# Patient Record
Sex: Female | Born: 1990 | Race: Black or African American | Hispanic: No | Marital: Single | Smoking: Never smoker
Health system: Southern US, Community
[De-identification: ages and names within clinical notes are randomized; demographics above are authoritative.]

## PROBLEM LIST (undated history)

## (undated) HISTORY — PX: ANKLE SURGERY: SHX546

---

## 2017-04-27 NOTE — L&D Delivery Note (Signed)
Delivery Summary for [name] BockPaula [facility]  Labor Events:   Preterm labor:   Rupture date: 10/18/2017  Rupture time: 9:31 AM  Rupture type: Artificial  Fluid Color:   Induction:   Augmentation:   Complications:   Cervical ripening:          Delivery:   Episiotomy:   Lacerations:   Repair suture:   Repair # of packets:   Blood loss (ml): 2230 ml   Information for the patient's newborn:  Chaney Bornerry, [facility] [[id number]][[id number]]    Delivery 10/18/2017 1:55 PM by  C-Section, Low Transverse Sex:  female Gestational Age: [id number] Delivery Clinician:  Hildred Laserherry, Ayva Veilleux MD Living?: No        APGARS  One minute Five minutes Ten minutes  Skin color:        Heart rate:        Grimace:        Muscle tone:        Breathing:        Totals: 0  0      Presentation/position:      Resuscitation:   Cord information:    Disposition of cord blood:     Blood gases sent?  Complications:   Placenta: Delivered:       appearance Newborn Measurements: Weight: 6 lb 6 oz (2892 g)  Height: 20.75"  Head circumference:    Chest circumference:    Other providers:    Additional  information: Forceps:   Vacuum:   Breech:   Observed anomalies       See Dr. Oretha Milchherry's operative note for details of C-section procedure.    Hildred Laserherry, Bobbyjoe Pabst, MD [facility]

## 2017-05-14 ENCOUNTER — Encounter: Payer: Self-pay | Admitting: Certified Nurse Midwife

## 2017-05-14 ENCOUNTER — Ambulatory Visit (INDEPENDENT_AMBULATORY_CARE_PROVIDER_SITE_OTHER): Payer: BLUE CROSS/BLUE SHIELD | Admitting: Certified Nurse Midwife

## 2017-05-14 VITALS — BP 129/75 | HR 103 | Ht 65.0 in | Wt 193.4 lb

## 2017-05-14 DIAGNOSIS — Z3402 Encounter for supervision of normal first pregnancy, second trimester: Secondary | ICD-10-CM

## 2017-05-14 DIAGNOSIS — N926 Irregular menstruation, unspecified: Secondary | ICD-10-CM

## 2017-05-14 LAB — POCT URINALYSIS DIPSTICK
Bilirubin, UA: NEGATIVE
GLUCOSE UA: NEGATIVE
LEUKOCYTES UA: NEGATIVE
NITRITE UA: NEGATIVE
Protein, UA: NEGATIVE
RBC UA: NEGATIVE
Urobilinogen, UA: 0.2 E.U./dL
pH, UA: 6 (ref 5.0–8.0)

## 2017-05-14 NOTE — Patient Instructions (Signed)
Back Pain in Pregnancy Back pain during pregnancy is common. Back pain may be caused by several factors that are related to changes during your pregnancy. Follow these instructions at home: Managing pain, stiffness, and swelling  If directed, apply ice for sudden (acute) back pain. ? Put ice in a plastic bag. ? Place a towel between your skin and the bag. ? Leave the ice on for 20 minutes, 2-3 times per day.  If directed, apply heat to the affected area before you exercise: ? Place a towel between your skin and the heat pack or heating pad. ? Leave the heat on for 20-30 minutes. ? Remove the heat if your skin turns bright red. This is especially important if you are unable to feel pain, heat, or cold. You may have a greater risk of getting burned. Activity  Exercise as told by your health care provider. Exercising is the best way to prevent or manage back pain.  Listen to your body when lifting. If lifting hurts, ask for help or bend your knees. This uses your leg muscles instead of your back muscles.  Squat down when picking up something from the floor. Do not bend over.  Only use bed rest as told by your health care provider. Bed rest should only be used for the most severe episodes of back pain. Standing, Sitting, and Lying Down  Do not stand in one place for long periods of time.  Use good posture when sitting. Make sure your head rests over your shoulders and is not hanging forward. Use a pillow on your lower back if necessary.  Try sleeping on your side, preferably the left side, with a pillow or two between your legs. If you are sore after a night's rest, your bed may be too soft. A firm mattress may provide more support for your back during pregnancy. General instructions  Do not wear high heels.  Eat a healthy diet. Try to gain weight within your health care provider's recommendations.  Use a maternity girdle, elastic sling, or back brace as told by your health care  provider.  Take over-the-counter and prescription medicines only as told by your health care provider.  Keep all follow-up visits as told by your health care provider. This is important. This includes any visits with any specialists, such as a physical therapist. Contact a health care provider if:  Your back pain interferes with your daily activities.  You have increasing pain in other parts of your body. Get help right away if:  You develop numbness, tingling, weakness, or problems with the use of your arms or legs.  You develop severe back pain that is not controlled with medicine.  You have a sudden change in bowel or bladder control.  You develop shortness of breath, dizziness, or you faint.  You develop nausea, vomiting, or sweating.  You have back pain that is a rhythmic, cramping pain similar to labor pains. Labor pain is usually 1-2 minutes apart, lasts for about 1 minute, and involves a bearing down feeling or pressure in your pelvis.  You have back pain and your water breaks or you have vaginal bleeding.  You have back pain or numbness that travels down your leg.  Your back pain developed after you fell.  You develop pain on one side of your back.  You see blood in your urine.  You develop skin blisters in the area of your back pain. This information is not intended to replace advice given to you   by your health care provider. Make sure you discuss any questions you have with your health care provider. Document Released: 07/22/2005 Document Revised: 09/19/2015 Document Reviewed: 12/26/2014 Elsevier Interactive Patient Education  2018 [place]. Abdominal Pain During Pregnancy Belly (abdominal) pain is common during pregnancy. Most of the time, it is not a serious problem. Other times, it can be a sign that something is wrong with the pregnancy. Always tell your doctor if you have belly pain. Follow these instructions at home: Monitor your belly pain for any  changes. The following actions may help you feel better:  Do not have sex (intercourse) or put anything in your vagina until you feel better.  Rest until your pain stops.  Drink clear fluids if you feel sick to your stomach (nauseous). Do not eat solid food until you feel better.  Only take medicine as told by your doctor.  Keep all doctor visits as told.  Get help right away if:  You are bleeding, leaking fluid, or pieces of tissue come out of your vagina.  You have more pain or cramping.  You keep throwing up (vomiting).  You have pain when you pee (urinate) or have blood in your pee.  You have a fever.  You do not feel your baby moving as much.  You feel very weak or feel like passing out.  You have trouble breathing, with or without belly pain.  You have a very bad headache and belly pain.  You have fluid leaking from your vagina and belly pain.  You keep having watery poop (diarrhea).  Your belly pain does not go away after resting, or the pain gets worse. This information is not intended to replace advice given to you by your health care provider. Make sure you discuss any questions you have with your health care provider. Document Released: 04/01/2009 Document Revised: 11/20/2015 Document Reviewed: 11/10/2012 Elsevier Interactive Patient Education  2018 [employer]. Morning Sickness Morning sickness is when you feel sick to your stomach (nauseous) during pregnancy. You may feel sick to your stomach and throw up (vomit). You may feel sick in the morning, but you can feel this way any time of day. Some women feel very sick to their stomach and cannot stop throwing up (hyperemesis gravidarum). Follow these instructions at home:  Only take medicines as told by your doctor.  Take multivitamins as told by your doctor. Taking multivitamins before getting pregnant can stop or lessen the harshness of morning sickness.  Eat dry toast or unsalted crackers before getting  out of bed.  Eat 5 to 6 small meals a day.  Eat dry and bland foods like rice and baked potatoes.  Do not drink liquids with meals. Drink between meals.  Do not eat greasy, fatty, or spicy foods.  Have someone cook for you if the smell of food causes you to feel sick or throw up.  If you feel sick to your stomach after taking prenatal vitamins, take them at night or with a snack.  Eat protein when you need a snack (nuts, yogurt, cheese).  Eat unsweetened gelatins for dessert.  Wear a bracelet used for sea sickness (acupressure wristband).  Go to a doctor that puts thin needles into certain body points (acupuncture) to improve how you feel.  Do not smoke.  Use a humidifier to keep the air in your house free of odors.  Get lots of fresh air. Contact a doctor if:  You need medicine to feel better.  You feel  dizzy or lightheaded.  You are losing weight. Get help right away if:  You feel very sick to your stomach and cannot stop throwing up.  You pass out (faint). This information is not intended to replace advice given to you by your health care provider. Make sure you discuss any questions you have with your health care provider. Document Released: 05/21/2004 Document Revised: 09/19/2015 Document Reviewed: 09/28/2012 Elsevier Interactive Patient Education  2017 [place] for Pregnant Women While you are pregnant, your body will require additional nutrition to help support your growing baby. It is recommended that you consume:  150 additional calories each day during your first trimester.  300 additional calories each day during your second trimester.  300 additional calories each day during your third trimester.  Eating a healthy, well-balanced diet is very important for your health and for your baby's health. You also have a higher need for some vitamins and minerals, such as folic acid, calcium, iron, and vitamin D. What do I need to know about  eating during pregnancy?  Do not try to lose weight or go on a diet during pregnancy.  Choose healthy, nutritious foods. Choose  of a sandwich with a glass of milk instead of a candy bar or a high-calorie sugar-sweetened beverage.  Limit your overall intake of foods that have "empty calories." These are foods that have little nutritional value, such as sweets, desserts, candies, sugar-sweetened beverages, and fried foods.  Eat a variety of foods, especially fruits and vegetables.  Take a prenatal vitamin to help meet the additional needs during pregnancy, specifically for folic acid, iron, calcium, and vitamin D.  Remember to stay active. Ask your health care provider for exercise recommendations that are specific to you.  Practice good food safety and cleanliness, such as washing your hands before you eat and after you prepare raw meat. This helps to prevent foodborne illnesses, such as listeriosis, that can be very dangerous for your baby. Ask your health care provider for more information about listeriosis. What does 150 extra calories look like? Healthy options for an additional 150 calories each day could be any of the following:  Plain low-fat yogurt (6-8 oz) with  cup of berries.  1 apple with 2 teaspoons of peanut butter.  Cut-up vegetables with  cup of hummus.  Low-fat chocolate milk (8 oz or 1 cup).  1 string cheese with 1 medium orange.   of a peanut butter and jelly sandwich on whole-wheat bread (1 tsp of peanut butter).  For 300 calories, you could eat two of those healthy options each day. What is a healthy amount of weight to gain? The recommended amount of weight for you to gain is based on your pre-pregnancy BMI. If your pre-pregnancy BMI was:  Less than 18 (underweight), you should gain 28-40 lb.  18-24.9 (normal), you should gain 25-35 lb.  25-29.9 (overweight), you should gain 15-25 lb.  Greater than 30 (obese), you should gain 11-20 lb.  What if I am  having twins or multiples? Generally, pregnant women who will be having twins or multiples may need to increase their daily calories by 300-600 calories each day. The recommended range for total weight gain is 25-54 lb, depending on your pre-pregnancy BMI. Talk with your health care provider for specific guidance about additional nutritional needs, weight gain, and exercise during your pregnancy. What foods can I eat? Grains Any grains. Try to choose whole grains, such as whole-wheat bread, oatmeal, or brown rice. Vegetables  Any vegetables. Try to eat a variety of colors and types of vegetables to get a full range of vitamins and minerals. Remember to wash your vegetables well before eating. Fruits Any fruits. Try to eat a variety of colors and types of fruit to get a full range of vitamins and minerals. Remember to wash your fruits well before eating. Meats and Other Protein Sources Lean meats, including chicken, [place], fish, and lean cuts of beef, veal, or pork. Make sure that all meats are cooked to "well done." Tofu. Tempeh. Beans. Eggs. Peanut butter and other nut butters. Seafood, such as shrimp, crab, and lobster. If you choose fish, select types that are higher in omega-3 fatty acids, including salmon, herring, mussels, trout, sardines, and pollock. Make sure that all meats are cooked to food-safe temperatures. Dairy Pasteurized milk and milk alternatives. Pasteurized yogurt and pasteurized cheese. Cottage cheese. Sour cream. Beverages Water. Juices that contain 100% fruit juice or vegetable juice. Caffeine-free teas and decaffeinated coffee. Drinks that contain caffeine are okay to drink, but it is better to avoid caffeine. Keep your total caffeine intake to less than 200 mg each day (12 oz of coffee, tea, or soda) or as directed by your health care provider. Condiments Any pasteurized condiments. Sweets and Desserts Any sweets and desserts. Fats and Oils Any fats and oils. The items  listed above may not be a complete list of recommended foods or beverages. Contact your dietitian for more options. What foods are not recommended? Vegetables Unpasteurized (raw) vegetable juices. Fruits Unpasteurized (raw) fruit juices. Meats and Other Protein Sources Cured meats that have nitrates, such as bacon, salami, and hotdogs. Luncheon meats, bologna, or other deli meats (unless they are reheated until they are steaming hot). Refrigerated pate, meat spreads from a meat counter, smoked seafood that is found in the refrigerated section of a store. Raw fish, such as sushi or sashimi. High mercury content fish, such as tilefish, shark, swordfish, and king mackerel. Raw meats, such as tuna or beef tartare. Undercooked meats and poultry. Make sure that all meats are cooked to food-safe temperatures. Dairy Unpasteurized (raw) milk and any foods that have raw milk in them. Soft cheeses, such as feta, queso blanco, queso fresco, Brie, Camembert cheeses, blue-veined cheeses, and Panela cheese (unless it is made with pasteurized milk, which must be stated on the label). Beverages Alcohol. Sugar-sweetened beverages, such as sodas, teas, or energy drinks. Condiments Homemade fermented foods and drinks, such as pickles, sauerkraut, or kombucha drinks. (Store-bought pasteurized versions of these are okay.) Other Salads that are made in the store, such as ham salad, chicken salad, egg salad, tuna salad, and seafood salad. The items listed above may not be a complete list of foods and beverages to avoid. Contact your dietitian for more information. This information is not intended to replace advice given to you by your health care provider. Make sure you discuss any questions you have with your health care provider. Document Released: 01/26/2014 Document Revised: 09/19/2015 Document Reviewed: 09/26/2013 Elsevier Interactive Patient Education  2018 [employer]. Common Medications Safe in  Pregnancy  Acne:      Constipation:  Benzoyl Peroxide     Colace  Clindamycin      Dulcolax Suppository  Topica Erythromycin     Fibercon  Salicylic Acid      Metamucil         Miralax AVOID:        Senakot   Accutane    Cough:  Retin-A  Cough Drops  Tetracycline      Phenergan w/ Codeine if Rx  Minocycline      Robitussin (Plain & DM)  Antibiotics:     Crabs/Lice:  Ceclor       RID  Cephalosporins    AVOID:  E-Mycins      Kwell  Keflex  Macrobid/Macrodantin   Diarrhea:  Penicillin      Kao-Pectate  Zithromax      Imodium AD         PUSH FLUIDS AVOID:       Cipro     Fever:  Tetracycline      Tylenol (Regular or Extra  Minocycline       Strength)  Levaquin      Extra Strength-Do not          Exceed 8 tabs/24 hrs Caffeine:        <252m/day (equiv. To 1 cup of coffee or  approx. 3 12 oz sodas)         Gas: Cold/Hayfever:       Gas-X  Benadryl      Mylicon  Claritin       Phazyme  **Claritin-D        Chlor-Trimeton    Headaches:  Dimetapp      ASA-Free Excedrin  Drixoral-Non-Drowsy     Cold Compress  Mucinex (Guaifenasin)     Tylenol (Regular or Extra  Sudafed/Sudafed-12 Hour     Strength)  **Sudafed PE Pseudoephedrine   Tylenol Cold & Sinus     Vicks Vapor Rub  Zyrtec  **AVOID if Problems With Blood Pressure         Heartburn: Avoid lying down for at least 1 hour after meals  Aciphex      Maalox     Rash:  Milk of Magnesia     Benadryl    Mylanta       1% Hydrocortisone Cream  Pepcid  Pepcid Complete   Sleep Aids:  Prevacid      Ambien   Prilosec       Benadryl  Rolaids       Chamomile Tea  Tums (Limit 4/day)     Unisom  Zantac       Tylenol PM         Warm milk-add vanilla or  Hemorrhoids:       Sugar for taste  Anusol/Anusol H.C.  (RX: Analapram 2.5%)  Sugar Substitutes:  Hydrocortisone OTC     Ok in moderation  Preparation H      Tucks        Vaseline lotion applied to tissue with  wiping    Herpes:     Throat:  Acyclovir      Oragel  Famvir  Valtrex     Vaccines:         Flu Shot Leg Cramps:       *Gardasil  Benadryl      Hepatitis A         Hepatitis B Nasal Spray:       Pneumovax  Saline Nasal Spray     Polio Booster         Tetanus Nausea:       Tuberculosis test or PPD  Vitamin B6 25 mg TID   AVOID:    Dramamine      *Gardasil  Emetrol       Live Poliovirus  Ginger Root 250 mg QID    MMR (measles, mumps &  High Complex Carbs @ Bedtime    rebella)  Sea Bands-Accupressure    Varicella (Chickenpox)  Unisom 1/2 tab TID     *No known complications           If received before Pain:         Known pregnancy;   Darvocet       Resume series after  Lortab        Delivery  Percocet    Yeast:   Tramadol      Femstat  Tylenol 3      Gyne-lotrimin  Ultram       Monistat  Vicodin           MISC:         All Sunscreens           Hair Coloring/highlights          Insect Repellant's          (Including DEET)         Mystic Tans Second Trimester of Pregnancy The second trimester is from week 13 through week 28, month 4 through 6. This is often the time in pregnancy that you feel your best. Often times, morning sickness has lessened or quit. You may have more energy, and you may get hungry more often. Your unborn baby (fetus) is growing rapidly. At the end of the sixth month, he or she is about 9 inches long and weighs about 1 pounds. You will likely feel the baby move (quickening) between 18 and 20 weeks of pregnancy. Follow these instructions at home:  Avoid all smoking, herbs, and alcohol. Avoid drugs not approved by your doctor.  Do not use any tobacco products, including cigarettes, chewing tobacco, and electronic cigarettes. If you need help quitting, ask your doctor. You may get counseling or other support to help you quit.  Only take medicine as told by your doctor. Some medicines are safe and some are not during pregnancy.  Exercise only as told by your  doctor. Stop exercising if you start having cramps.  Eat regular, healthy meals.  Wear a good support bra if your breasts are tender.  Do not use hot tubs, steam rooms, or saunas.  Wear your seat belt when driving.  Avoid raw meat, uncooked cheese, and liter boxes and soil used by cats.  Take your prenatal vitamins.  Take 1500-2000 milligrams of calcium daily starting at the 20th week of pregnancy until you deliver your baby.  Try taking medicine that helps you poop (stool softener) as needed, and if your doctor approves. Eat more fiber by eating fresh fruit, vegetables, and whole grains. Drink enough fluids to keep your pee (urine) clear or pale yellow.  Take warm water baths (sitz baths) to soothe pain or discomfort caused by hemorrhoids. Use hemorrhoid cream if your doctor approves.  If you have puffy, bulging veins (varicose veins), wear support hose. Raise (elevate) your feet for 15 minutes, 3-4 times a day. Limit salt in your diet.  Avoid heavy lifting, wear low heals, and sit up straight.  Rest with your legs raised if you have leg cramps or low back pain.  Visit your dentist if you have not gone during your pregnancy. Use a soft toothbrush to brush your teeth. Be gentle when you floss.  You can have sex (intercourse) unless your doctor tells you not to.  Go to your doctor visits. Get help if:  You feel dizzy.  You have mild cramps or pressure in  your lower belly (abdomen).  You have a nagging pain in your belly area.  You continue to feel sick to your stomach (nauseous), throw up (vomit), or have watery poop (diarrhea).  You have bad smelling fluid coming from your vagina.  You have pain with peeing (urination). Get help right away if:  You have a fever.  You are leaking fluid from your vagina.  You have spotting or bleeding from your vagina.  You have severe belly cramping or pain.  You lose or gain weight rapidly.  You have trouble catching your  breath and have chest pain.  You notice sudden or extreme puffiness (swelling) of your face, hands, ankles, feet, or legs.  You have not felt the baby move in over an hour.  You have severe headaches that do not go away with medicine.  You have vision changes. This information is not intended to replace advice given to you by your health care provider. Make sure you discuss any questions you have with your health care provider. Document Released: 07/08/2009 Document Revised: 09/19/2015 Document Reviewed: 06/14/2012 Elsevier Interactive Patient Education  2017 [employer] WHAT OB PATIENTS CAN EXPECT   Confirmation of pregnancy and ultrasound ordered if medically indicated-[redacted] weeks gestation  New OB (NOB) intake with nurse and New OB (NOB) labs- [redacted] weeks gestation  New OB (NOB) physical examination with provider- 11/[redacted] weeks gestation  Flu vaccine-[redacted] weeks gestation  Anatomy scan-[redacted] weeks gestation  Glucose tolerance test, blood work to test for anemia, T-dap vaccine-[redacted] weeks gestation  Vaginal swabs/cultures-STD/Group B strep-[redacted] weeks gestation  Appointments every 4 weeks until 28 weeks  Every 2 weeks from 28 weeks until 36 weeks  Weekly visits from 36 weeks until delivery

## 2017-05-15 LAB — CBC WITH DIFFERENTIAL/PLATELET
Basophils Absolute: 0 10*3/uL (ref 0.0–0.2)
Basos: 0 %
EOS (ABSOLUTE): 0.1 10*3/uL (ref 0.0–0.4)
EOS: 1 %
Hematocrit: 37.9 % (ref 34.0–46.6)
Hemoglobin: 13.1 g/dL (ref 11.1–15.9)
IMMATURE GRANULOCYTES: 1 %
Immature Grans (Abs): 0.1 10*3/uL (ref 0.0–0.1)
Lymphocytes Absolute: 2.2 10*3/uL (ref 0.7–3.1)
Lymphs: 28 %
MCH: 30 pg (ref 26.6–33.0)
MCHC: 34.6 g/dL (ref 31.5–35.7)
MCV: 87 fL (ref 79–97)
MONOS ABS: 0.5 10*3/uL (ref 0.1–0.9)
Monocytes: 7 %
NEUTROS PCT: 63 %
Neutrophils Absolute: 5.1 10*3/uL (ref 1.4–7.0)
PLATELETS: 202 10*3/uL (ref 150–379)
RBC: 4.36 x10E6/uL (ref 3.77–5.28)
RDW: 13.7 % (ref 12.3–15.4)
WBC: 7.9 10*3/uL (ref 3.4–10.8)

## 2017-05-15 LAB — URINALYSIS, ROUTINE W REFLEX MICROSCOPIC
Bilirubin, UA: NEGATIVE
Glucose, UA: NEGATIVE
LEUKOCYTES UA: NEGATIVE
Nitrite, UA: NEGATIVE
PH UA: 5.5 (ref 5.0–7.5)
PROTEIN UA: NEGATIVE
RBC, UA: NEGATIVE
Specific Gravity, UA: >=1.03 — AB (ref 1.005–1.030)
Urobilinogen, Ur: 0.2 mg/dL (ref 0.2–1.0)

## 2017-05-15 LAB — ABO AND RH: Rh Factor: POSITIVE

## 2017-05-15 LAB — SICKLE CELL SCREEN: Sickle Cell Screen: NEGATIVE

## 2017-05-15 LAB — VARICELLA ZOSTER ANTIBODY, IGG: Varicella zoster IgG: 1937 index (ref >165)

## 2017-05-15 LAB — RPR: RPR: NONREACTIVE

## 2017-05-15 LAB — ANTIBODY SCREEN: Antibody Screen: NEGATIVE

## 2017-05-15 LAB — HEPATITIS B SURFACE ANTIGEN: HEP B S AG: NEGATIVE

## 2017-05-15 LAB — RUBELLA SCREEN: Rubella Antibodies, IGG: 5.13 index (ref >0.99)

## 2017-05-15 LAB — HIV ANTIBODY (ROUTINE TESTING W REFLEX): HIV SCREEN 4TH GENERATION: NONREACTIVE

## 2017-05-15 NOTE — Progress Notes (Signed)
NEW OB HISTORY AND PHYSICAL  SUBJECTIVE:       [name] Castro is a [age] y.o. G1P0 female, Patient's last menstrual period was 02/08/2017 (exact date)., Estimated Date of Delivery: 11/15/17, [id number], presents today for establishment of Prenatal Care.  She has no unusual complaints and complains of nausea without vomiting.   Denies difficulty breathing or respiratory distress, chest pain, abdominal pain, vaginal bleeding, dysuria, change to vaginal discharge, and leg pain or swelling.    Gynecologic History  Patient's last menstrual period was 02/08/2017 (exact date).   Last Pap: due.   Obstetric History  OB History  Gravida Para Term Preterm AB Living  1            SAB TAB Ectopic Multiple Live Births               # Outcome Date GA Lbr Len/2nd Weight Sex Delivery Anes PTL Lv  1 Current               History reviewed. No pertinent past medical history.  Past Surgical History:  Procedure Laterality Date  . ANKLE SURGERY      Current Outpatient Medications on File Prior to Visit  Medication Sig Dispense Refill  . Prenatal Vit-Fe Fumarate-FA (PRENATAL MULTIVITAMIN) TABS tablet Take 1 tablet by mouth daily at 12 noon.     No current facility-administered medications on file prior to visit.     No Known Allergies  Social History   Socioeconomic History  . Marital status: Single    Spouse name: Not on file  . Number of children: Not on file  . Years of education: Not on file  . Highest education level: Not on file  Social Needs  . Financial resource strain: Not on file  . Food insecurity - worry: Not on file  . Food insecurity - inability: Not on file  . Transportation needs - medical: Not on file  . Transportation needs - non-medical: Not on file  Occupational History  . Not on file  Tobacco Use  . Smoking status: Never Smoker  . Smokeless tobacco: Never Used  Substance and Sexual Activity  . Alcohol use: No    Frequency: Never  . Drug use: No  . Sexual activity:  Yes  Other Topics Concern  . Not on file  Social History Narrative  . Not on file    Family History  Problem Relation Age of Onset  . Cancer Mother        Breast  . Hypertension Maternal Grandmother   . Diabetes Maternal Grandfather     The following portions of the patient's history were reviewed and updated as appropriate: allergies, current medications, past OB history, past medical history, past surgical history, past family history, past social history, and problem list.    OBJECTIVE:  BP 129/75   Pulse (!) 103   Ht 5\' 5"  (1.651 m)   Wt 193 lb 6.4 oz (87.7 kg)   LMP 02/08/2017 (Exact Date)   BMI 32.18 kg/m   Initial Physical Exam (New OB)  GENERAL APPEARANCE: alert, well appearing, in no apparent distress  HEAD: normocephalic, atraumatic  MOUTH: mucous membranes moist, pharynx normal without lesions  THYROID: no thyromegaly or masses present  BREASTS: no masses noted, no significant tenderness, no palpable axillary nodes, no skin changes  LUNGS: clear to auscultation, no wheezes, rales or rhonchi, symmetric air entry  HEART: regular rate and rhythm, no murmurs  ABDOMEN: soft, nontender, nondistended, no abnormal masses, no epigastric  pain, obese and FHT present  EXTREMITIES: no redness or tenderness in the calves or thighs  SKIN: normal coloration and turgor, no rashes  LYMPH NODES: no adenopathy palpable  NEUROLOGIC: alert, oriented, normal speech, no focal findings or movement disorder noted  PELVIC EXAM EXTERNAL GENITALIA: normal appearing vulva with no masses, tenderness or lesions VAGINA: no abnormal discharge or lesions CERVIX: no lesions or cervical motion tenderness and Pap collected UTERUS: gravid and consistent with 13 weeks ADNEXA: no masses palpable and nontender OB EXAM PELVIMETRY: appears adequate  ASSESSMENT: Normal pregnancy Screening for cervical cancer  PLAN: Prenatal care New OB counseling: The patient has been given an  overview regarding routine prenatal care. Recommendations regarding diet, weight gain, and exercise in pregnancy were given. Prenatal testing, optional genetic testing, and ultrasound use in pregnancy were reviewed.  Benefits of Breast Feeding were discussed. The patient is encouraged to consider nursing her baby post partum. See orders   Vanessa Poynor Miyako Oelke,CNM [facility], [facility]

## 2017-05-16 LAB — URINE CULTURE

## 2017-05-16 LAB — GC/CHLAMYDIA PROBE AMP
Chlamydia trachomatis, NAA: NEGATIVE
Neisseria gonorrhoeae by PCR: NEGATIVE

## 2017-05-17 LAB — PAP IG, CT-NG, RFX HPV ASCU
Chlamydia, Nuc. Acid Amp: NEGATIVE
Gonococcus by Nucleic Acid Amp: NEGATIVE
PAP SMEAR COMMENT: 0

## 2017-05-17 LAB — MONITOR DRUG PROFILE 14(MW)
AMPHETAMINE SCREEN URINE: NEGATIVE ng/mL
BARBITURATE SCREEN URINE: NEGATIVE ng/mL
BENZODIAZEPINE SCREEN, URINE: NEGATIVE ng/mL
Buprenorphine, Urine: NEGATIVE ng/mL
CANNABINOIDS UR QL SCN: NEGATIVE ng/mL
Cocaine (Metab) Scrn, Ur: NEGATIVE ng/mL
Creatinine(Crt), U: 233.7 mg/dL (ref 20.0–300.0)
FENTANYL, URINE: NEGATIVE pg/mL
MEPERIDINE SCREEN, URINE: NEGATIVE ng/mL
Methadone Screen, Urine: NEGATIVE ng/mL
OPIATE SCREEN URINE: NEGATIVE ng/mL
OXYCODONE+OXYMORPHONE UR QL SCN: NEGATIVE ng/mL
PROPOXYPHENE SCREEN URINE: NEGATIVE ng/mL
Ph of Urine: 5.5 (ref 4.5–8.9)
Phencyclidine Qn, Ur: NEGATIVE ng/mL
SPECIFIC GRAVITY: 1.033
TRAMADOL SCREEN, URINE: NEGATIVE ng/mL

## 2017-05-17 NOTE — Progress Notes (Signed)
Please contact with results. Pap normal. Blood type O positive. All other labs normal for pregnancy. Encourage MyChart activation. Thanks, JML

## 2017-06-11 ENCOUNTER — Ambulatory Visit (INDEPENDENT_AMBULATORY_CARE_PROVIDER_SITE_OTHER): Payer: BLUE CROSS/BLUE SHIELD | Admitting: Certified Nurse Midwife

## 2017-06-11 VITALS — BP 118/72 | HR 94 | Wt 196.7 lb

## 2017-06-11 DIAGNOSIS — Z3402 Encounter for supervision of normal first pregnancy, second trimester: Secondary | ICD-10-CM | POA: Diagnosis not present

## 2017-06-11 LAB — POCT URINALYSIS DIPSTICK
BILIRUBIN UA: NEGATIVE
GLUCOSE UA: NEGATIVE
KETONES UA: NEGATIVE
LEUKOCYTES UA: NEGATIVE
Nitrite, UA: NEGATIVE
Protein, UA: NEGATIVE
RBC UA: NEGATIVE
SPEC GRAV UA: 1.015 (ref 1.010–1.025)
Urobilinogen, UA: 0.2 E.U./dL
pH, UA: 7 (ref 5.0–8.0)

## 2017-06-11 NOTE — Progress Notes (Signed)
ROB PT HIP PAINS. NO OTHER CONCERNS.

## 2017-06-11 NOTE — Progress Notes (Signed)
ROB, doing well. Discussed round ligament pain. anticipatory guidance for 20 wk u/s at next visit. Pt verbalizes understanding and agrees to plan. Follow up 3 wks.   Doreene BurkeAnnie Cambri Plourde, CNM

## 2017-06-11 NOTE — Patient Instructions (Signed)

## 2017-07-01 ENCOUNTER — Other Ambulatory Visit: Payer: BLUE CROSS/BLUE SHIELD

## 2017-07-02 ENCOUNTER — Other Ambulatory Visit: Payer: BLUE CROSS/BLUE SHIELD

## 2017-07-02 ENCOUNTER — Encounter: Payer: BLUE CROSS/BLUE SHIELD | Admitting: Certified Nurse Midwife

## 2017-07-07 ENCOUNTER — Ambulatory Visit (INDEPENDENT_AMBULATORY_CARE_PROVIDER_SITE_OTHER): Payer: BLUE CROSS/BLUE SHIELD

## 2017-07-07 ENCOUNTER — Encounter: Payer: BLUE CROSS/BLUE SHIELD | Admitting: Certified Nurse Midwife

## 2017-07-07 ENCOUNTER — Ambulatory Visit (INDEPENDENT_AMBULATORY_CARE_PROVIDER_SITE_OTHER): Payer: BLUE CROSS/BLUE SHIELD | Admitting: Certified Nurse Midwife

## 2017-07-07 ENCOUNTER — Encounter: Payer: Self-pay | Admitting: Certified Nurse Midwife

## 2017-07-07 ENCOUNTER — Other Ambulatory Visit: Payer: BLUE CROSS/BLUE SHIELD

## 2017-07-07 VITALS — BP 112/69 | HR 91 | Wt 200.1 lb

## 2017-07-07 DIAGNOSIS — Z3402 Encounter for supervision of normal first pregnancy, second trimester: Secondary | ICD-10-CM | POA: Diagnosis not present

## 2017-07-07 DIAGNOSIS — Z3A21 21 weeks gestation of pregnancy: Secondary | ICD-10-CM

## 2017-07-07 NOTE — Progress Notes (Signed)
ROB doing well. No complaints. Anatomy u/s today was incomplete but otherwise normal. She will return in 2 wks for completion of scan ( see below for full report). She feels good movement and denies contractions. Follow up ROB in 4 wks.   Doreene BurkeAnnie Tehya Leath, CNM    ULTRASOUND REPORT  Location: [facility] Date of Service:  07/07/2017  Indications: Anatomy Findings:  Mason JimSingleton intrauterine pregnancy is visualized with FHR at 145 BPM. Biometrics give an (U/S) Gestational age of 26 2/7 weeks and an (U/S) EDD of 11/15/17; this correlates with the clinically established EDD of 11/15/17.  Fetal presentation is variable breech positions.  EFW: 426 grams (0lb 15oz). Placenta: Anterior and grade 1.  Placenta is remote to cervix at 2.9 cm from cervical os. AFI: WNL subjectively.  Anatomic survey is incomplete.  Visualized anatomy appears WNL.Views of the fetal profile and nose/lips are needed to complete survey.  Gender - Surprise.   Right Ovary measures 2.5 x 2.4 x 2.0 cm. It is normal in appearance. Left Ovary measures 2.4 x 2.1 x 1.6 cm. It is normal appearance. There is no obvious evidence of a corpus luteal cyst. Survey of the adnexa demonstrates no adnexal masses. There is no free peritoneal fluid in the cul de sac.  Impression: 1. 21 2/7 week Viable Singleton Intrauterine pregnancy by U/S. 2. (U/S) EDD is consistent with Clinically established (LMP) EDD of 11/15/17. 3. Incomplete anatomy scan.  Views of the fetal profile and nose/lips are needed to complete anatomical survey.  Recommendations: 1.Clinical correlation with the patient's History and Physical Exam. 2. F/U U/S recommended in 2 weeks to complete anatomical survey.  Kari BaarsJill Long, RDMS

## 2017-07-07 NOTE — Progress Notes (Signed)
Pt is here for an ROB visit. 

## 2017-07-07 NOTE — Patient Instructions (Signed)

## 2017-07-21 ENCOUNTER — Ambulatory Visit (INDEPENDENT_AMBULATORY_CARE_PROVIDER_SITE_OTHER): Payer: BLUE CROSS/BLUE SHIELD

## 2017-07-21 DIAGNOSIS — Z3A21 21 weeks gestation of pregnancy: Secondary | ICD-10-CM

## 2017-08-04 ENCOUNTER — Encounter: Payer: Self-pay | Admitting: Obstetrics and Gynecology

## 2017-08-04 ENCOUNTER — Ambulatory Visit (INDEPENDENT_AMBULATORY_CARE_PROVIDER_SITE_OTHER): Payer: BLUE CROSS/BLUE SHIELD | Admitting: Obstetrics and Gynecology

## 2017-08-04 VITALS — BP 109/73 | HR 101 | Wt 201.3 lb

## 2017-08-04 DIAGNOSIS — Z3492 Encounter for supervision of normal pregnancy, unspecified, second trimester: Secondary | ICD-10-CM

## 2017-08-04 LAB — POCT URINALYSIS DIPSTICK
Bilirubin, UA: NEGATIVE
Glucose, UA: NEGATIVE
Ketones, UA: NEGATIVE
LEUKOCYTES UA: NEGATIVE
Nitrite, UA: NEGATIVE
PH UA: 6.5 (ref 5.0–8.0)
Protein, UA: NEGATIVE
RBC UA: NEGATIVE
Spec Grav, UA: 1.015 (ref 1.010–1.025)
UROBILINOGEN UA: 0.2 U/dL

## 2017-08-04 NOTE — Progress Notes (Signed)
ROB- pt is doing well 

## 2017-08-04 NOTE — Progress Notes (Signed)
ROB- doing well, encouraged enrolling in classes, discussed long commute to work and posibbly stopping work in last month.

## 2017-08-26 ENCOUNTER — Other Ambulatory Visit: Payer: BLUE CROSS/BLUE SHIELD

## 2017-08-26 ENCOUNTER — Ambulatory Visit (INDEPENDENT_AMBULATORY_CARE_PROVIDER_SITE_OTHER): Payer: BLUE CROSS/BLUE SHIELD | Admitting: Certified Nurse Midwife

## 2017-08-26 ENCOUNTER — Encounter: Payer: BLUE CROSS/BLUE SHIELD | Admitting: Certified Nurse Midwife

## 2017-08-26 VITALS — BP 107/68 | HR 95 | Wt 204.3 lb

## 2017-08-26 DIAGNOSIS — Z23 Encounter for immunization: Secondary | ICD-10-CM | POA: Diagnosis not present

## 2017-08-26 DIAGNOSIS — Z130 Encounter for screening for diseases of the blood and blood-forming organs and certain disorders involving the immune mechanism: Secondary | ICD-10-CM

## 2017-08-26 DIAGNOSIS — Z3403 Encounter for supervision of normal first pregnancy, third trimester: Secondary | ICD-10-CM

## 2017-08-26 DIAGNOSIS — Z113 Encounter for screening for infections with a predominantly sexual mode of transmission: Secondary | ICD-10-CM

## 2017-08-26 DIAGNOSIS — Z131 Encounter for screening for diabetes mellitus: Secondary | ICD-10-CM

## 2017-08-26 LAB — POCT URINALYSIS DIPSTICK
Bilirubin, UA: NEGATIVE
Blood, UA: NEGATIVE
Glucose, UA: NEGATIVE
Ketones, UA: NEGATIVE
LEUKOCYTES UA: NEGATIVE
NITRITE UA: NEGATIVE
PROTEIN UA: NEGATIVE
Spec Grav, UA: 1.02 (ref 1.010–1.025)
Urobilinogen, UA: 0.2 E.U./dL
pH, UA: 5 (ref 5.0–8.0)

## 2017-08-26 MED ORDER — TETANUS-DIPHTH-ACELL PERTUSSIS 5-2.5-18.5 LF-MCG/0.5 IM SUSP
0.5000 mL | Freq: Once | INTRAMUSCULAR | Status: AC
  Administered 2017-08-26: 0.5 mL via INTRAMUSCULAR

## 2017-08-26 NOTE — Progress Notes (Signed)
ROB-Doing well, no questions or concerns. 28 week labs today. TDaP given. Blood consent reviewed red and signed. Discussed intrapartum pain management options, cord blood banking, peditrician selection, and postpartum contraception. Reviewed red flag symptoms and when to call. RTC x 2 weeks for ROB or sooner if needed.

## 2017-08-26 NOTE — Addendum Note (Signed)
Addended by: Brooke Dare on: 08/26/2017 09:29 AM   Modules accepted: Orders

## 2017-08-26 NOTE — Progress Notes (Signed)
Pt is here for an ROB visit. 

## 2017-08-26 NOTE — Patient Instructions (Signed)
Common Medications Safe in Pregnancy  Acne:      Constipation:  Benzoyl Peroxide     Colace  Clindamycin      Dulcolax Suppository  Topica Erythromycin     Fibercon  Salicylic Acid      Metamucil         Miralax AVOID:        Senakot   Accutane    Cough:  Retin-A       Cough Drops  Tetracycline      Phenergan w/ Codeine if Rx  Minocycline      Robitussin (Plain & DM)  Antibiotics:     Crabs/Lice:  Ceclor       RID  Cephalosporins    AVOID:  E-Mycins      Kwell  Keflex  Macrobid/Macrodantin   Diarrhea:  Penicillin      Kao-Pectate  Zithromax      Imodium AD         PUSH FLUIDS AVOID:       Cipro     Fever:  Tetracycline      Tylenol (Regular or Extra  Minocycline       Strength)  Levaquin      Extra Strength-Do not          Exceed 8 tabs/24 hrs Caffeine:        <200mg/day (equiv. To 1 cup of coffee or  approx. 3 12 oz sodas)         Gas: Cold/Hayfever:       Gas-X  Benadryl      Mylicon  Claritin       Phazyme  **Claritin-D        Chlor-Trimeton    Headaches:  Dimetapp      ASA-Free Excedrin  Drixoral-Non-Drowsy     Cold Compress  Mucinex (Guaifenasin)     Tylenol (Regular or Extra  Sudafed/Sudafed-12 Hour     Strength)  **Sudafed PE Pseudoephedrine   Tylenol Cold & Sinus     Vicks Vapor Rub  Zyrtec  **AVOID if Problems With Blood Pressure         Heartburn: Avoid lying down for at least 1 hour after meals  Aciphex      Maalox     Rash:  Milk of Magnesia     Benadryl    Mylanta       1% Hydrocortisone Cream  Pepcid  Pepcid Complete   Sleep Aids:  Prevacid      Ambien   Prilosec       Benadryl  Rolaids       Chamomile Tea  Tums (Limit 4/day)     Unisom  Zantac       Tylenol PM         Warm milk-add vanilla or  Hemorrhoids:       Sugar for taste  Anusol/Anusol H.C.  (RX: Analapram 2.5%)  Sugar Substitutes:  Hydrocortisone OTC     Ok in moderation  Preparation H      Tucks        Vaseline lotion applied to tissue with  wiping    Herpes:     Throat:  Acyclovir      Oragel  Famvir  Valtrex     Vaccines:         Flu Shot Leg Cramps:       *Gardasil  Benadryl      Hepatitis A         Hepatitis B Nasal Spray:         Pneumovax  Saline Nasal Spray     Polio Booster         Tetanus Nausea:       Tuberculosis test or PPD  Vitamin B6 25 mg TID   AVOID:    Dramamine      *Gardasil  Emetrol       Live Poliovirus  Ginger Root 250 mg QID    MMR (measles, mumps &  High Complex Carbs @ Bedtime    rebella)  Sea Bands-Accupressure    Varicella (Chickenpox)  Unisom 1/2 tab TID     *No known complications           If received before Pain:         Known pregnancy;   Darvocet       Resume series after  Lortab        Delivery  Percocet    Yeast:   Tramadol      Femstat  Tylenol 3      Gyne-lotrimin  Ultram       Monistat  Vicodin           MISC:         All Sunscreens           Hair Coloring/highlights          Insect Repellant's          (Including DEET)         Mystic Tans WHAT OB PATIENTS CAN EXPECT   Confirmation of pregnancy and ultrasound ordered if medically indicated-[redacted] weeks gestation  New OB (NOB) intake with nurse and New OB (NOB) labs- [redacted] weeks gestation  New OB (NOB) physical examination with provider- 11/[redacted] weeks gestation  Flu vaccine-[redacted] weeks gestation  Anatomy scan-[redacted] weeks gestation  Glucose tolerance test, blood work to test for anemia, T-dap vaccine-[redacted] weeks gestation  Vaginal swabs/cultures-STD/Group B strep-[redacted] weeks gestation  Appointments every 4 weeks until 28 weeks  Every 2 weeks from 28 weeks until 36 weeks  Weekly visits from 36 weeks until delivery  [facility]  [place], [place], [place] [address]  Phone: [phone]   [facility] (second location)  [address] [place], [place] [address]  Phone: [phone]   [facility] [place], [place], [place] [address] Phone:  [phone]   [address] [place], [place] [address]  Phone: [phone] Relief During Labor and Delivery Many things can cause pain during labor and delivery, including:  Pressure on bones and ligaments due to the baby moving through the pelvis.  Stretching of tissues due to the baby moving through the birth canal.  Muscle tension due to anxiety or nervousness.  The uterus tightening (contracting) and relaxing to help move the baby.  There are many ways to deal with the pain of labor and delivery. They include:  Taking prenatal classes. Taking these classes helps you know what to expect during your baby's birth. What you learn will increase your confidence and decrease your anxiety.  Practicing relaxation techniques or doing relaxing activities, such as: ? Focused breathing. ? Meditation. ? Visualization. ? Aroma therapy. ? Listening to your favorite music. ? Hypnosis.  Taking a warm shower or bath (hydrotherapy). This may: ? Provide comfort and relaxation. ? Lessen your perception of pain. ? Decrease the amount of pain medicine needed. ? Decrease the length of labor.  Getting a massage or counterpressure on your back.  Applying warm packs or ice packs.  Changing positions often, moving around, or using a birthing ball.  Getting: ? Pain medicine through an IV or injection into a muscle. ? Pain medicine inserted into your spinal column. ? Injections of sterile water just under the skin on your lower back (intradermal injections). ? Laughing gas (nitrous oxide).  Discuss your pain control options with your health care provider during your prenatal visits. Explore the options offered by your hospital or birth center. What kinds of medicine are available? There are two kinds of medicines that can be used to relieve pain during labor and delivery:  Analgesics. These medicines decrease pain without causing you to lose feeling or the  ability to move your muscles.  Anesthetics. These medicines block feeling in the body and can decrease your ability to move freely.  Both of these kinds of medicine can cause minor side effects, such as nausea, trouble concentrating, and sleepiness. They can also decrease the baby's heart rate before birth and affect the baby's breathing rate after birth. For this reason, health care providers are careful about when and how much medicine is given. What are specific medicines and procedures that provide pain relief? Local Anesthetics Local anesthetics are used to numb a small area of the body. They may be used along with another kind of anesthetic or used to numb the nerves of the vagina, cervix, and perineum during the second stage of labor. General Anesthetics General anesthetics cause you to lose consciousness so you do not feel pain. They are usually only used for an emergency cesarean delivery. General anesthetics are given through an IV tube and a mask. Pudendal Block A pudendal block is a form of local anesthetic. It may be used to relieve the pain associated with pushing or stretching of the perineum at the time of delivery or to further numb the perineum. A pudendal block is done by injecting numbing medicine through the vaginal wall into a nerve in the pelvis. Epidural Analgesia Epidural analgesia is given through a flexible IV catheter that is inserted into the lower back. Numbing medicine is delivered continuously to the area near your spinal column nerves (epidural space). After having this type of analgesia, you may be able to move your legs but you most likely will not be able to walk. Depending on the amount of medicine given, you may lose all feeling in the lower half of your body, or you may retain some level of sensation, including the urge to push. Epidural analgesia can be used to provide pain relief for a vaginal birth. Spinal Block A spinal block is similar to epidural analgesia,  but the medicine is injected into the spinal fluid instead of the epidural space. A spinal block is only given once. It starts to relieve pain quickly, but the pain relief lasts only 1-6 hours. Spinal blocks can be used for cesarean deliveries. Combined Spinal-Epidural (CSE) Block A CSE block combines the effects of a spinal block and epidural analgesia. The spinal block works quickly to block all pain. The epidural analgesia provides continuous pain relief, even after the effects of the spinal block have worn off. This information is not intended to replace advice given to you by your health care provider. Make sure you discuss any questions you have with your health care provider. Document Released: 07/30/2008 Document Revised: 09/20/2015 Document Reviewed: 09/04/2015 Elsevier Interactive Patient Education  2018 [employer]. Cord Blood Banking Information Cord blood banking is the process of collecting and  storing the blood that is in the umbilical cord and placenta at the time of delivery. This blood contains stem cells, which can be used to treat many blood diseases, immune system disorders, and childhood cancers. Stem cells can also be used to research certain diseases and treatments. Many people who choose cord blood banking donate the blood. Donated blood can be used in lifesaving treatments or for research. Other people choose to store the blood privately. Blood that is stored privately can only be used with the person's permission. This option is often chosen if:  A family member needs a stem cell transplant.  The child is part of an ethnic minority.  The child was conceived through in vitro fertilization.  What should I look for in a blood bank? A blood bank is the organization that coordinates cord blood banking. Make sure the cord blood bank that you use:  Is accredited.  Is financially stable.  Handles a large volume of cord blood samples.  Has a procedure in place for  transport and storage.  Allows you the option of transferring your cord blood sample.  Has a procedure in place if the bank goes out of business.  Clearly states all costs and limits to future costs.  People who choose to donate cord blood should not need to pay for blood banking. People who keep the blood for private use will need to pay for the first (initial) storage and pay a fee each year (annual fee). Other fees may also apply. What are the risks of cord blood banking? There are no health risks associated with cord blood banking. It is considered safe. How should I prepare? You must schedule this process at least 4-6 weeks before you will be giving birth. How is the blood collected? The blood is collected as soon as the baby has been delivered. Within 15 minutes of delivery, a health care provider will take these actions to collect the blood:  Clamp the umbilical cord at the top and bottom. This traps the blood in the umbilical cord.  Use a syringe or bag to collect the blood.  Insert needles into the placenta to collect (draw out) more blood.  What happens after the blood is collected? After the blood has been collected:  The blood will be sent to a blood bank.  The blood will be tested for genetic problems and infectious diseases. If the blood tests positive for a genetic problem or a disease, someone will contact you and let you know.  The blood will be frozen.  If your child develops a genetic condition, immune system disorder, or cancer, you will be responsible for contacting the blood bank and letting them know. This information is not intended to replace advice given to you by your health care provider. Make sure you discuss any questions you have with your health care provider. Document Released: 10/01/2009 Document Revised: 09/19/2015 Document Reviewed: 10/01/2014 Elsevier Interactive Patient Education  2018 [employer]. Breastfeeding Choosing to breastfeed is one  of the best decisions you can make for yourself and your baby. A change in hormones during pregnancy causes your breasts to make breast milk in your milk-producing glands. Hormones prevent breast milk from being released before your baby is born. They also prompt milk flow after birth. Once breastfeeding has begun, thoughts of your baby, as well as his or her sucking or crying, can stimulate the release of milk from your milk-producing glands. Benefits of breastfeeding Research shows that breastfeeding offers many  health benefits for infants and mothers. It also offers a cost-free and convenient way to feed your baby. For your baby  Your first milk (colostrum) helps your baby's digestive system to function better.  Special cells in your milk (antibodies) help your baby to fight off infections.  Breastfed babies are less likely to develop asthma, allergies, obesity, or type 2 diabetes. They are also at lower risk for sudden infant death syndrome (SIDS).  Nutrients in breast milk are better able to meet your baby's needs compared to infant formula.  Breast milk improves your baby's brain development. For you  Breastfeeding helps to create a very special bond between you and your baby.  Breastfeeding is convenient. Breast milk costs nothing and is always available at the correct temperature.  Breastfeeding helps to burn calories. It helps you to lose the weight that you gained during pregnancy.  Breastfeeding makes your uterus return faster to its size before pregnancy. It also slows bleeding (lochia) after you give birth.  Breastfeeding helps to lower your risk of developing type 2 diabetes, osteoporosis, rheumatoid arthritis, cardiovascular disease, and breast, ovarian, uterine, and endometrial cancer later in life. Breastfeeding basics Starting breastfeeding  Find a comfortable place to sit or lie down, with your neck and back well-supported.  Place a pillow or a rolled-up blanket under  your baby to bring him or her to the level of your breast (if you are seated). Nursing pillows are specially designed to help support your arms and your baby while you breastfeed.  Make sure that your baby's tummy (abdomen) is facing your abdomen.  Gently massage your breast. With your fingertips, massage from the outer edges of your breast inward toward the nipple. This encourages milk flow. If your milk flows slowly, you may need to continue this action during the feeding.  Support your breast with 4 fingers underneath and your thumb above your nipple (make the letter "C" with your hand). Make sure your fingers are well away from your nipple and your baby's mouth.  Stroke your baby's lips gently with your finger or nipple.  When your baby's mouth is open wide enough, quickly bring your baby to your breast, placing your entire nipple and as much of the areola as possible into your baby's mouth. The areola is the colored area around your nipple. ? More areola should be visible above your baby's upper lip than below the lower lip. ? Your baby's lips should be opened and extended outward (flanged) to ensure an adequate, comfortable latch. ? Your baby's tongue should be between his or her lower gum and your breast.  Make sure that your baby's mouth is correctly positioned around your nipple (latched). Your baby's lips should create a seal on your breast and be turned out (everted).  It is common for your baby to suck about 2-3 minutes in order to start the flow of breast milk. Latching Teaching your baby how to latch onto your breast properly is very important. An improper latch can cause nipple pain, decreased milk supply, and poor weight gain in your baby. Also, if your baby is not latched onto your nipple properly, he or she may swallow some air during feeding. This can make your baby fussy. Burping your baby when you switch breasts during the feeding can help to get rid of the air. However,  teaching your baby to latch on properly is still the best way to prevent fussiness from swallowing air while breastfeeding. Signs that your baby has successfully  latched onto your nipple  Silent tugging or silent sucking, without causing you pain. Infant's lips should be extended outward (flanged).  Swallowing heard between every 3-4 sucks once your milk has started to flow (after your let-down milk reflex occurs).  Muscle movement above and in front of his or her ears while sucking.  Signs that your baby has not successfully latched onto your nipple  Sucking sounds or smacking sounds from your baby while breastfeeding.  Nipple pain.  If you think your baby has not latched on correctly, slip your finger into the corner of your baby's mouth to break the suction and place it between your baby's gums. Attempt to start breastfeeding again. Signs of successful breastfeeding Signs from your baby  Your baby will gradually decrease the number of sucks or will completely stop sucking.  Your baby will fall asleep.  Your baby's body will relax.  Your baby will retain a small amount of milk in his or her mouth.  Your baby will let go of your breast by himself or herself.  Signs from you  Breasts that have increased in firmness, weight, and size 1-3 hours after feeding.  Breasts that are softer immediately after breastfeeding.  Increased milk volume, as well as a change in milk consistency and color by the fifth day of breastfeeding.  Nipples that are not sore, cracked, or bleeding.  Signs that your baby is getting enough milk  Wetting at least 1-2 diapers during the first 24 hours after birth.  Wetting at least 5-6 diapers every 24 hours for the first week after birth. The urine should be clear or pale yellow by the age of 5 days.  Wetting 6-8 diapers every 24 hours as your baby continues to grow and develop.  At least 3 stools in a 24-hour period by the age of 5 days. The stool  should be soft and yellow.  At least 3 stools in a 24-hour period by the age of 7 days. The stool should be seedy and yellow.  No loss of weight greater than 10% of birth weight during the first 3 days of life.  Average weight gain of 4-7 oz (113-198 g) per week after the age of 4 days.  Consistent daily weight gain by the age of 5 days, without weight loss after the age of 2 weeks. After a feeding, your baby may spit up a small amount of milk. This is normal. Breastfeeding frequency and duration Frequent feeding will help you make more milk and can prevent sore nipples and extremely full breasts (breast engorgement). Breastfeed when you feel the need to reduce the fullness of your breasts or when your baby shows signs of hunger. This is called "breastfeeding on demand." Signs that your baby is hungry include:  Increased alertness, activity, or restlessness.  Movement of the head from side to side.  Opening of the mouth when the corner of the mouth or cheek is stroked (rooting).  Increased sucking sounds, smacking lips, cooing, sighing, or squeaking.  Hand-to-mouth movements and sucking on fingers or hands.  Fussing or crying.  Avoid introducing a pacifier to your baby in the first 4-6 weeks after your baby is born. After this time, you may choose to use a pacifier. Research has shown that pacifier use during the first year of a baby's life decreases the risk of sudden infant death syndrome (SIDS). Allow your baby to feed on each breast as long as he or she wants. When your baby unlatches or  falls asleep while feeding from the first breast, offer the second breast. Because newborns are often sleepy in the first few weeks of life, you may need to awaken your baby to get him or her to feed. Breastfeeding times will vary from baby to baby. However, the following rules can serve as a guide to help you make sure that your baby is properly fed:  Newborns (babies 39 weeks of age or younger) may  breastfeed every 1-3 hours.  Newborns should not go without breastfeeding for longer than 3 hours during the day or 5 hours during the night.  You should breastfeed your baby a minimum of 8 times in a 24-hour period.  Breast milk pumping Pumping and storing breast milk allows you to make sure that your baby is exclusively fed your breast milk, even at times when you are unable to breastfeed. This is especially important if you go back to work while you are still breastfeeding, or if you are not able to be present during feedings. Your lactation consultant can help you find a method of pumping that works best for you and give you guidelines about how long it is safe to store breast milk. Caring for your breasts while you breastfeed Nipples can become dry, cracked, and sore while breastfeeding. The following recommendations can help keep your breasts moisturized and healthy:  Avoid using soap on your nipples.  Wear a supportive bra designed especially for nursing. Avoid wearing underwire-style bras or extremely tight bras (sports bras).  Air-dry your nipples for 3-4 minutes after each feeding.  Use only cotton bra pads to absorb leaked breast milk. Leaking of breast milk between feedings is normal.  Use lanolin on your nipples after breastfeeding. Lanolin helps to maintain your skin's normal moisture barrier. Pure lanolin is not harmful (not toxic) to your baby. You may also hand express a few drops of breast milk and gently massage that milk into your nipples and allow the milk to air-dry.  In the first few weeks after giving birth, some women experience breast engorgement. Engorgement can make your breasts feel heavy, warm, and tender to the touch. Engorgement peaks within 3-5 days after you give birth. The following recommendations can help to ease engorgement:  Completely empty your breasts while breastfeeding or pumping. You may want to start by applying warm, moist heat (in the shower or  with warm, water-soaked hand towels) just before feeding or pumping. This increases circulation and helps the milk flow. If your baby does not completely empty your breasts while breastfeeding, pump any extra milk after he or she is finished.  Apply ice packs to your breasts immediately after breastfeeding or pumping, unless this is too uncomfortable for you. To do this: ? Put ice in a plastic bag. ? Place a towel between your skin and the bag. ? Leave the ice on for 20 minutes, 2-3 times a day.  Make sure that your baby is latched on and positioned properly while breastfeeding.  If engorgement persists after 48 hours of following these recommendations, contact your health care provider or a Science writer. Overall health care recommendations while breastfeeding  Eat 3 healthy meals and 3 snacks every day. Well-nourished mothers who are breastfeeding need an additional 450-500 calories a day. You can meet this requirement by increasing the amount of a balanced diet that you eat.  Drink enough water to keep your urine pale yellow or clear.  Rest often, relax, and continue to take your prenatal vitamins to prevent  fatigue, stress, and low vitamin and mineral levels in your body (nutrient deficiencies).  Do not use any products that contain nicotine or tobacco, such as cigarettes and e-cigarettes. Your baby may be harmed by chemicals from cigarettes that pass into breast milk and exposure to secondhand smoke. If you need help quitting, ask your health care provider.  Avoid alcohol.  Do not use illegal drugs or marijuana.  Talk with your health care provider before taking any medicines. These include over-the-counter and prescription medicines as well as vitamins and herbal supplements. Some medicines that may be harmful to your baby can pass through breast milk.  It is possible to become pregnant while breastfeeding. If birth control is desired, ask your health care provider about  options that will be safe while breastfeeding your baby. Where to find more information: [employer]: www.llli.org Contact a health care provider if:  You feel like you want to stop breastfeeding or have become frustrated with breastfeeding.  Your nipples are cracked or bleeding.  Your breasts are red, tender, or warm.  You have: ? Painful breasts or nipples. ? A swollen area on either breast. ? A fever or chills. ? Nausea or vomiting. ? Drainage other than breast milk from your nipples.  Your breasts do not become full before feedings by the fifth day after you give birth.  You feel sad and depressed.  Your baby is: ? Too sleepy to eat well. ? Having trouble sleeping. ? More than 36 week old and wetting fewer than 6 diapers in a 24-hour period. ? Not gaining weight by 82 days of age.  Your baby has fewer than 3 stools in a 24-hour period.  Your baby's skin or the white parts of his or her eyes become yellow. Get help right away if:  Your baby is overly tired (lethargic) and does not want to wake up and feed.  Your baby develops an unexplained fever. Summary  Breastfeeding offers many health benefits for infant and mothers.  Try to breastfeed your infant when he or she shows early signs of hunger.  Gently tickle or stroke your baby's lips with your finger or nipple to allow the baby to open his or her mouth. Bring the baby to your breast. Make sure that much of the areola is in your baby's mouth. Offer one side and burp the baby before you offer the other side.  Talk with your health care provider or lactation consultant if you have questions or you face problems as you breastfeed. This information is not intended to replace advice given to you by your health care provider. Make sure you discuss any questions you have with your health care provider. Document Released: 04/13/2005 Document Revised: 05/15/2016 Document Reviewed: 05/15/2016 Elsevier  Interactive Patient Education  2018 [place] of Pregnancy The third trimester is from week 29 through week 42, months 7 through 9. This trimester is when your unborn baby (fetus) is growing very fast. At the end of the ninth month, the unborn baby is about 20 inches in length. It weighs about 6-10 pounds. Follow these instructions at home:  Avoid all smoking, herbs, and alcohol. Avoid drugs not approved by your doctor.  Do not use any tobacco products, including cigarettes, chewing tobacco, and electronic cigarettes. If you need help quitting, ask your doctor. You may get counseling or other support to help you quit.  Only take medicine as told by your doctor. Some medicines are safe and some are  not during pregnancy.  Exercise only as told by your doctor. Stop exercising if you start having cramps.  Eat regular, healthy meals.  Wear a good support bra if your breasts are tender.  Do not use hot tubs, steam rooms, or saunas.  Wear your seat belt when driving.  Avoid raw meat, uncooked cheese, and liter boxes and soil used by cats.  Take your prenatal vitamins.  Take 1500-2000 milligrams of calcium daily starting at the 20th week of pregnancy until you deliver your baby.  Try taking medicine that helps you poop (stool softener) as needed, and if your doctor approves. Eat more fiber by eating fresh fruit, vegetables, and whole grains. Drink enough fluids to keep your pee (urine) clear or pale yellow.  Take warm water baths (sitz baths) to soothe pain or discomfort caused by hemorrhoids. Use hemorrhoid cream if your doctor approves.  If you have puffy, bulging veins (varicose veins), wear support hose. Raise (elevate) your feet for 15 minutes, 3-4 times a day. Limit salt in your diet.  Avoid heavy lifting, wear low heels, and sit up straight.  Rest with your legs raised if you have leg cramps or low back pain.  Visit your dentist if you have not gone during  your pregnancy. Use a soft toothbrush to brush your teeth. Be gentle when you floss.  You can have sex (intercourse) unless your doctor tells you not to.  Do not travel far distances unless you must. Only do so with your doctor's approval.  Take prenatal classes.  Practice driving to the hospital.  Pack your hospital bag.  Prepare the baby's room.  Go to your doctor visits. Get help if:  You are not sure if you are in labor or if your water has broken.  You are dizzy.  You have mild cramps or pressure in your lower belly (abdominal).  You have a nagging pain in your belly area.  You continue to feel sick to your stomach (nauseous), throw up (vomit), or have watery poop (diarrhea).  You have bad smelling fluid coming from your vagina.  You have pain with peeing (urination). Get help right away if:  You have a fever.  You are leaking fluid from your vagina.  You are spotting or bleeding from your vagina.  You have severe belly cramping or pain.  You lose or gain weight rapidly.  You have trouble catching your breath and have chest pain.  You notice sudden or extreme puffiness (swelling) of your face, hands, ankles, feet, or legs.  You have not felt the baby move in over an hour.  You have severe headaches that do not go away with medicine.  You have vision changes. This information is not intended to replace advice given to you by your health care provider. Make sure you discuss any questions you have with your health care provider. Document Released: 07/08/2009 Document Revised: 09/19/2015 Document Reviewed: 06/14/2012 Elsevier Interactive Patient Education  2017 [employer].

## 2017-08-27 LAB — CBC
Hematocrit: 37.5 % (ref 34.0–46.6)
Hemoglobin: 12.8 g/dL (ref 11.1–15.9)
MCH: 30.9 pg (ref 26.6–33.0)
MCHC: 34.1 g/dL (ref 31.5–35.7)
MCV: 91 fL (ref 79–97)
Platelets: 156 10*3/uL (ref 150–379)
RBC: 4.14 x10E6/uL (ref 3.77–5.28)
RDW: 14.2 % (ref 12.3–15.4)
WBC: 6.7 10*3/uL (ref 3.4–10.8)

## 2017-08-27 LAB — RPR: RPR Ser Ql: NONREACTIVE

## 2017-08-27 LAB — GLUCOSE, 1 HOUR GESTATIONAL: GESTATIONAL DIABETES SCREEN: 141 mg/dL — AB (ref 65–139)

## 2017-09-02 NOTE — Progress Notes (Signed)
Please contact patient. One (1) hour glucola elevated. Needs three (3) hour with next visit or sooner. Thanks, JML

## 2017-09-03 ENCOUNTER — Telehealth: Payer: Self-pay

## 2017-09-03 NOTE — Telephone Encounter (Signed)
Message left on voice mail- pt needs to schedule a 3 hour glucose test per provider.

## 2017-09-08 ENCOUNTER — Encounter: Payer: BLUE CROSS/BLUE SHIELD | Admitting: Certified Nurse Midwife

## 2017-09-10 ENCOUNTER — Encounter: Payer: BLUE CROSS/BLUE SHIELD | Admitting: Obstetrics and Gynecology

## 2017-09-10 ENCOUNTER — Other Ambulatory Visit: Payer: BLUE CROSS/BLUE SHIELD

## 2017-09-24 ENCOUNTER — Ambulatory Visit (INDEPENDENT_AMBULATORY_CARE_PROVIDER_SITE_OTHER): Payer: BLUE CROSS/BLUE SHIELD | Admitting: Obstetrics and Gynecology

## 2017-09-24 ENCOUNTER — Other Ambulatory Visit: Payer: BLUE CROSS/BLUE SHIELD

## 2017-09-24 VITALS — BP 101/71 | HR 92 | Wt 213.0 lb

## 2017-09-24 DIAGNOSIS — Z3493 Encounter for supervision of normal pregnancy, unspecified, third trimester: Secondary | ICD-10-CM

## 2017-09-24 DIAGNOSIS — R7309 Other abnormal glucose: Secondary | ICD-10-CM

## 2017-09-24 NOTE — Progress Notes (Signed)
ROB- pt is doing her 3h GTT, is doing well

## 2017-09-24 NOTE — Progress Notes (Signed)
ROB & 3hGTT-discussed glucose results, doing well except left thumb pain- unknown etiology

## 2017-09-25 LAB — GESTATIONAL GLUCOSE TOLERANCE
GLUCOSE 3 HOUR GTT: 97 mg/dL (ref 65–139)
GLUCOSE FASTING: 83 mg/dL (ref 65–94)
Glucose, GTT - 1 Hour: 140 mg/dL (ref 65–179)
Glucose, GTT - 2 Hour: 124 mg/dL (ref 65–154)

## 2017-09-29 ENCOUNTER — Telehealth: Payer: Self-pay | Admitting: *Deleted

## 2017-09-29 NOTE — Telephone Encounter (Signed)
-----   Message from [place] N [place], [place] sent at 09/28/2017 10:12 AM EDT ----- Please let her know she passed diabetes screen

## 2017-09-29 NOTE — Telephone Encounter (Signed)
Notified pt of results 

## 2017-10-08 ENCOUNTER — Ambulatory Visit (INDEPENDENT_AMBULATORY_CARE_PROVIDER_SITE_OTHER): Payer: BLUE CROSS/BLUE SHIELD | Admitting: Certified Nurse Midwife

## 2017-10-08 VITALS — BP 101/77 | HR 81 | Wt 217.0 lb

## 2017-10-08 DIAGNOSIS — Z3403 Encounter for supervision of normal first pregnancy, third trimester: Secondary | ICD-10-CM

## 2017-10-08 LAB — POCT URINALYSIS DIPSTICK
Bilirubin, UA: NEGATIVE
Blood, UA: NEGATIVE
Glucose, UA: NEGATIVE
Ketones, UA: NEGATIVE
LEUKOCYTES UA: NEGATIVE
NITRITE UA: NEGATIVE
PROTEIN UA: POSITIVE — AB
Spec Grav, UA: 1.025 (ref 1.010–1.025)
Urobilinogen, UA: 0.2 E.U./dL
pH, UA: 6 (ref 5.0–8.0)

## 2017-10-08 NOTE — Patient Instructions (Addendum)
Vaginal Delivery Vaginal delivery means that you will give birth by pushing your baby out of your birth canal (vagina). A team of health care providers will help you before, during, and after vaginal delivery. Birth experiences are unique for every woman and every pregnancy, and birth experiences vary depending on where you choose to give birth. What should I do to prepare for my baby's birth? Before your baby is born, it is important to talk with your health care provider about:  Your labor and delivery preferences. These may include: ? Medicines that you may be given. ? How you will manage your pain. This might include non-medical pain relief techniques or injectable pain relief such as epidural analgesia. ? How you and your baby will be monitored during labor and delivery. ? Who may be in the labor and delivery room with you. ? Your feelings about surgical delivery of your baby (cesarean delivery, or C-section) if this becomes necessary. ? Your feelings about receiving donated blood through an IV tube (blood transfusion) if this becomes necessary.  Whether you are able: ? To take pictures or videos of the birth. ? To eat during labor and delivery. ? To move around, walk, or change positions during labor and delivery.  What to expect after your baby is born, such as: ? Whether delayed umbilical cord clamping and cutting is offered. ? Who will care for your baby right after birth. ? Medicines or tests that may be recommended for your baby. ? Whether breastfeeding is supported in your hospital or birth center. ? How long you will be in the hospital or birth center.  How any medical conditions you have may affect your baby or your labor and delivery experience.  To prepare for your baby's birth, you should also:  Attend all of your health care visits before delivery (prenatal visits) as recommended by your health care provider. This is important.  Prepare your home for your baby's  arrival. Make sure that you have: ? Diapers. ? Baby clothing. ? Feeding equipment. ? Safe sleeping arrangements for you and your baby.  Install a car seat in your vehicle. Have your car seat checked by a certified car seat installer to make sure that it is installed safely.  Think about who will help you with your new baby at home for at least the first several weeks after delivery.  What can I expect when I arrive at the birth center or hospital? Once you are in labor and have been admitted into the hospital or birth center, your health care provider may:  Review your pregnancy history and any concerns you have.  Insert an IV tube into one of your veins. This is used to give you fluids and medicines.  Check your blood pressure, pulse, temperature, and heart rate (vital signs).  Check whether your bag of water (amniotic sac) has broken (ruptured).  Talk with you about your birth plan and discuss pain control options.  Monitoring Your health care provider may monitor your contractions (uterine monitoring) and your baby's heart rate (fetal monitoring). You may need to be monitored:  Often, but not continuously (intermittently).  All the time or for long periods at a time (continuously). Continuous monitoring may be needed if: ? You are taking certain medicines, such as medicine to relieve pain or make your contractions stronger. ? You have pregnancy or labor complications.  Monitoring may be done by:  Placing a special stethoscope or a handheld monitoring device on your abdomen to   check your baby's heartbeat, and feeling your abdomen for contractions. This method of monitoring does not continuously record your baby's heartbeat or your contractions.  Placing monitors on your abdomen (external monitors) to record your baby's heartbeat and the frequency and length of contractions. You may not have to wear external monitors all the time.  Placing monitors inside of your uterus  (internal monitors) to record your baby's heartbeat and the frequency, length, and strength of your contractions. ? Your health care provider may use internal monitors if he or she needs more information about the strength of your contractions or your baby's heart rate. ? Internal monitors are put in place by passing a thin, flexible wire through your vagina and into your uterus. Depending on the type of monitor, it may remain in your uterus or on your baby's head until birth. ? Your health care provider will discuss the benefits and risks of internal monitoring with you and will ask for your permission before inserting the monitors.  Telemetry. This is a type of continuous monitoring that can be done with external or internal monitors. Instead of having to stay in bed, you are able to move around during telemetry. Ask your health care provider if telemetry is an option for you.  Physical exam Your health care provider may perform a physical exam. This may include:  Checking whether your baby is positioned: ? With the head toward your vagina (head-down). This is most common. ? With the head toward the top of your uterus (head-up or breech). If your baby is in a breech position, your health care provider may try to turn your baby to a head-down position so you can deliver vaginally. If it does not seem that your baby can be born vaginally, your provider may recommend surgery to deliver your baby. In rare cases, you may be able to deliver vaginally if your baby is head-up (breech delivery). ? Lying sideways (transverse). Babies that are lying sideways cannot be delivered vaginally.  Checking your cervix to determine: ? Whether it is thinning out (effacing). ? Whether it is opening up (dilating). ? How low your baby has moved into your birth canal.  What are the three stages of labor and delivery?  Normal labor and delivery is divided into the following three stages: Stage 1  Stage 1 is the  longest stage of labor, and it can last for hours or days. Stage 1 includes: ? Early labor. This is when contractions may be irregular, or regular and mild. Generally, early labor contractions are more than 10 minutes apart. ? Active labor. This is when contractions get longer, more regular, more frequent, and more intense. ? The transition phase. This is when contractions happen very close together, are very intense, and may last longer than during any other part of labor.  Contractions generally feel mild, infrequent, and irregular at first. They get stronger, more frequent (about every 2-3 minutes), and more regular as you progress from early labor through active labor and transition.  Many women progress through stage 1 naturally, but you may need help to continue making progress. If this happens, your health care provider may talk with you about: ? Rupturing your amniotic sac if it has not ruptured yet. ? Giving you medicine to help make your contractions stronger and more frequent.  Stage 1 ends when your cervix is completely dilated to 4 inches (10 cm) and completely effaced. This happens at the end of the transition phase. Stage 2  Once   your cervix is completely effaced and dilated to 4 inches (10 cm), you may start to feel an urge to push. It is common for the body to naturally take a rest before feeling the urge to push, especially if you received an epidural or certain other pain medicines. This rest period may last for up to 1-2 hours, depending on your unique labor experience.  During stage 2, contractions are generally less painful, because pushing helps relieve contraction pain. Instead of contraction pain, you may feel stretching and burning pain, especially when the widest part of your baby's head passes through the vaginal opening (crowning).  Your health care provider will closely monitor your pushing progress and your baby's progress through the vagina during stage 2.  Your  health care provider may massage the area of skin between your vaginal opening and anus (perineum) or apply warm compresses to your perineum. This helps it stretch as the baby's head starts to crown, which can help prevent perineal tearing. ? In some cases, an incision may be made in your perineum (episiotomy) to allow the baby to pass through the vaginal opening. An episiotomy helps to make the opening of the vagina larger to allow more room for the baby to fit through.  It is very important to breathe and focus so your health care provider can control the delivery of your baby's head. Your health care provider may have you decrease the intensity of your pushing, to help prevent perineal tearing.  After delivery of your baby's head, the shoulders and the rest of the body generally deliver very quickly and without difficulty.  Once your baby is delivered, the umbilical cord may be cut right away, or this may be delayed for 1-2 minutes, depending on your baby's health. This may vary among health care providers, hospitals, and birth centers.  If you and your baby are healthy enough, your baby may be placed on your chest or abdomen to help maintain the baby's temperature and to help you bond with each other. Some mothers and babies start breastfeeding at this time. Your health care team will dry your baby and help keep your baby warm during this time.  Your baby may need immediate care if he or she: ? Showed signs of distress during labor. ? Has a medical condition. ? Was born too early (prematurely). ? Had a bowel movement before birth (meconium). ? Shows signs of difficulty transitioning from being inside the uterus to being outside of the uterus. If you are planning to breastfeed, your health care team will help you begin a feeding. Stage 3  The third stage of labor starts immediately after the birth of your baby and ends after you deliver the placenta. The placenta is an organ that develops  during pregnancy to provide oxygen and nutrients to your baby in the womb.  Delivering the placenta may require some pushing, and you may have mild contractions. Breastfeeding can stimulate contractions to help you deliver the placenta.  After the placenta is delivered, your uterus should tighten (contract) and become firm. This helps to stop bleeding in your uterus. To help your uterus contract and to control bleeding, your health care provider may: ? Give you medicine by injection, through an IV tube, by mouth, or through your rectum (rectally). ? Massage your abdomen or perform a vaginal exam to remove any blood clots that are left in your uterus. ? Empty your bladder by placing a thin, flexible tube (catheter) into your bladder. ? Encourage  you to breastfeed your baby. After labor is over, you and your baby will be monitored closely to ensure that you are both healthy until you are ready to go home. Your health care team will teach you how to care for yourself and your baby. This information is not intended to replace advice given to you by your health care provider. Make sure you discuss any questions you have with your health care provider. Document Released: 01/21/2008 Document Revised: 11/01/2015 Document Reviewed: 04/28/2015 Elsevier Interactive Patient Education  2018 [employer]. Back Pain in Pregnancy Back pain during pregnancy is common. Back pain may be caused by several factors that are related to changes during your pregnancy. Follow these instructions at home: Managing pain, stiffness, and swelling  If directed, apply ice for sudden (acute) back pain. ? Put ice in a plastic bag. ? Place a towel between your skin and the bag. ? Leave the ice on for 20 minutes, 2-3 times per day.  If directed, apply heat to the affected area before you exercise: ? Place a towel between your skin and the heat pack or heating pad. ? Leave the heat on for 20-30 minutes. ? Remove the heat if  your skin turns bright red. This is especially important if you are unable to feel pain, heat, or cold. You may have a greater risk of getting burned. Activity  Exercise as told by your health care provider. Exercising is the best way to prevent or manage back pain.  Listen to your body when lifting. If lifting hurts, ask for help or bend your knees. This uses your leg muscles instead of your back muscles.  Squat down when picking up something from the floor. Do not bend over.  Only use bed rest as told by your health care provider. Bed rest should only be used for the most severe episodes of back pain. Standing, Sitting, and Lying Down  Do not stand in one place for long periods of time.  Use good posture when sitting. Make sure your head rests over your shoulders and is not hanging forward. Use a pillow on your lower back if necessary.  Try sleeping on your side, preferably the left side, with a pillow or two between your legs. If you are sore after a night's rest, your bed may be too soft. A firm mattress may provide more support for your back during pregnancy. General instructions  Do not wear high heels.  Eat a healthy diet. Try to gain weight within your health care provider's recommendations.  Use a maternity girdle, elastic sling, or back brace as told by your health care provider.  Take over-the-counter and prescription medicines only as told by your health care provider.  Keep all follow-up visits as told by your health care provider. This is important. This includes any visits with any specialists, such as a physical therapist. Contact a health care provider if:  Your back pain interferes with your daily activities.  You have increasing pain in other parts of your body. Get help right away if:  You develop numbness, tingling, weakness, or problems with the use of your arms or legs.  You develop severe back pain that is not controlled with medicine.  You have a sudden  change in bowel or bladder control.  You develop shortness of breath, dizziness, or you faint.  You develop nausea, vomiting, or sweating.  You have back pain that is a rhythmic, cramping pain similar to labor pains. Labor pain is usually  1-2 minutes apart, lasts for about 1 minute, and involves a bearing down feeling or pressure in your pelvis.  You have back pain and your water breaks or you have vaginal bleeding.  You have back pain or numbness that travels down your leg.  Your back pain developed after you fell.  You develop pain on one side of your back.  You see blood in your urine.  You develop skin blisters in the area of your back pain. This information is not intended to replace advice given to you by your health care provider. Make sure you discuss any questions you have with your health care provider. Document Released: 07/22/2005 Document Revised: 09/19/2015 Document Reviewed: 12/26/2014 Elsevier Interactive Patient Education  2018 [employer] WHAT OB PATIENTS CAN EXPECT   Confirmation of pregnancy and ultrasound ordered if medically indicated-[redacted] weeks gestation  New OB (NOB) intake with nurse and New OB (NOB) labs- [redacted] weeks gestation  New OB (NOB) physical examination with provider- 11/[redacted] weeks gestation  Flu vaccine-[redacted] weeks gestation  Anatomy scan-[redacted] weeks gestation  Glucose tolerance test, blood work to test for anemia, T-dap vaccine-[redacted] weeks gestation  Vaginal swabs/cultures-STD/Group B strep-[redacted] weeks gestation  Appointments every 4 weeks until 28 weeks  Every 2 weeks from 28 weeks until 36 weeks  Weekly visits from 36 weeks until delivery  Common Medications Safe in Pregnancy  Acne:      Constipation:  Benzoyl Peroxide     Colace  Clindamycin      Dulcolax Suppository  Topica Erythromycin     Fibercon  Salicylic Acid      Metamucil         Miralax AVOID:        Senakot   Accutane    Cough:  Retin-A       Cough  Drops  Tetracycline      Phenergan w/ Codeine if Rx  Minocycline      Robitussin (Plain & DM)  Antibiotics:     Crabs/Lice:  Ceclor       RID  Cephalosporins    AVOID:  E-Mycins      Kwell  Keflex  Macrobid/Macrodantin   Diarrhea:  Penicillin      Kao-Pectate  Zithromax      Imodium AD         PUSH FLUIDS AVOID:       Cipro     Fever:  Tetracycline      Tylenol (Regular or Extra  Minocycline       Strength)  Levaquin      Extra Strength-Do not          Exceed 8 tabs/24 hrs Caffeine:        '200mg'$ /day (equiv. To 1 cup of coffee or  approx. 3 12 oz sodas)         Gas: Cold/Hayfever:       Gas-X  Benadryl      Mylicon  Claritin       Phazyme  **Claritin-D        Chlor-Trimeton    Headaches:  Dimetapp      ASA-Free Excedrin  Drixoral-Non-Drowsy     Cold Compress  Mucinex (Guaifenasin)     Tylenol (Regular or Extra  Sudafed/Sudafed-12 Hour     Strength)  **Sudafed PE Pseudoephedrine   Tylenol Cold & Sinus     Vicks Vapor Rub  Zyrtec  **AVOID if Problems With Blood Pressure         Heartburn: Avoid lying down  for at least 1 hour after meals  Aciphex      Maalox     Rash:  Milk of Magnesia     Benadryl    Mylanta       1% Hydrocortisone Cream  Pepcid  Pepcid Complete   Sleep Aids:  Prevacid      Ambien   Prilosec       Benadryl  Rolaids       Chamomile Tea  Tums (Limit 4/day)     Unisom  Zantac       Tylenol PM         Warm milk-add vanilla or  Hemorrhoids:       Sugar for taste  Anusol/Anusol H.C.  (RX: Analapram 2.5%)  Sugar Substitutes:  Hydrocortisone OTC     Ok in moderation  Preparation H      Tucks        Vaseline lotion applied to tissue with wiping    Herpes:     Throat:  Acyclovir      Oragel  Famvir  Valtrex     Vaccines:         Flu Shot Leg Cramps:       *Gardasil  Benadryl      Hepatitis A         Hepatitis B Nasal Spray:       Pneumovax  Saline Nasal Spray     Polio Booster         Tetanus Nausea:       Tuberculosis test or  PPD  Vitamin B6 25 mg TID   AVOID:    Dramamine      *Gardasil  Emetrol       Live Poliovirus  Ginger Root 250 mg QID    MMR (measles, mumps &  High Complex Carbs @ Bedtime    rebella)  Sea Bands-Accupressure    Varicella (Chickenpox)  Unisom 1/2 tab TID     *No known complications           If received before Pain:         Known pregnancy;   Darvocet       Resume series after  Lortab        Delivery  Percocet    Yeast:   Tramadol      Femstat  Tylenol 3      Gyne-lotrimin  Ultram       Monistat  Vicodin           MISC:         All Sunscreens           Hair Coloring/highlights          Insect Repellant's          (Including DEET)         Mystic Tans  Pregnancy and Travel Most pregnant woman can safely travel until the last month of the pregnancy. However, pregnant women with medical problems or problems with their pregnancy should limit or avoid travel. The best time to travel is between 14 and 28 weeks of the pregnancy. During this period, morning sickness should be minimal and other problems are less likely to develop. General travel tips Before you go:  Discuss your trip with your health care provider and get examined shortly before you go.  Get a copy of your medical records and be sure to take them with you.  Try to get names of doctors and hospitals in the area you  will be visiting.  Pack your pillow if you can.  Get a good night's sleep the night before you make your trip.  During your trip:  Ask for locations of doctors and hospitals if you did not do this before leaving.  Wear flat, comfortable shoes.  Eat a balanced diet, drink a lot of fluids, and take your vitamins and supplements.  Do not wear yourself out.  Do not ride on a motorcycle.  Rest. If you spent a lot of time traveling, lie down for 30 or more minutes with your feet slightly raised after you reach your destination.  Tips for traveling to a foreign country Before you go:  Ask your health  care provider if there are any medicines that are safe to take if you get diarrhea, constipation, nausea, or vomiting.  Make copies of your medical records in case you lose the originals.  During your trip:  Do not eat uncooked foods of any kind.  Drink bottled water and do not use ice.  Wash fruits and vegetables with hot, soapy water.  Only drink pasteurized milk.  Tips for traveling by car  Wear your seat belt properly.  If you are in the front seat, sit as far away from the dashboard as possible to avoid getting hit hard if the air bag deploys in an accident.  Stop about every 2 hours to use the restroom and walk around. This helps the circulation in your legs.  Keep water, crackers, and fruit in the car.  Do not travel for more than 6 hours a day. Tips for traveling by bus  Before making a reservation, ask whether your bus will have a restroom.  Take water, crackers, and fruit with you.  Get out and walk around if and when the bus stops.  Move your arms and legs when seated. This helps with your circulation. Tips for traveling by train Before making a reservation, ask if your train will have a sleeping car and more than one restroom. Tips for traveling by airplane  Before booking your trip, ask about the airline's rules about pregnancy. Pregnant women may be restricted from [place] after a certain time of the pregnancy. Every airline has its own rules and regulations.  Ask whether the airplane cabin will be pressurized. Do not board an unpressurized plane that will fly above 7,000 ft (2,100 km).  Try to get a bulkhead or an aisle seat.  Wear layered clothing because the temperature in the cabin can change.  Take water, crackers, and fruit with you on the airplane.  Put all your medicines and medical records in your carry-on bag.  Avoid drinks with caffeine and do not eat a big meal.  Do not walk around the airplane to stretch your legs.  Move your arms and  legs while sitting to help with your circulation.  Wear your seat belt at all times. Tips for traveling by cruise ship  Before booking your trip, ask the following questions: ? Are pregnant women allowed on the cruise ship? ? Is there a medical facility and health care provider on board? ? Does the ship dock in cities where there are health care providers and medical facilities?  Before booking your trip, ask your health care provider if: ? It is safe for you to take medicines if you get seasick. ? It is safe for you to wear acupressure wristbands to prevent getting seasick. If your health care provider says it is safe, consider purchasing one. This  information is not intended to replace advice given to you by your health care provider. Make sure you discuss any questions you have with your health care provider. Document Released: 03/26/2008 Document Revised: 09/19/2015 Document Reviewed: 03/10/2013 Elsevier Interactive Patient Education  2017 [employer].

## 2017-10-08 NOTE — Progress Notes (Signed)
ROB- pt stated that she was doing well and had no concerns. .Marland Kitchen

## 2017-10-08 NOTE — Progress Notes (Signed)
ROB-Doing well, discussed starting maternity leave at 36 weeks due to work travel. Pregnancy travel precaution reviewed. Encouraged use of compression stockings. Anticipatory guidance regarding course of prenatal care. Reviewed red flag symptoms and when to call. RTC x 2 weeks for 36 week cultures and ROB or sooner if needed.

## 2017-10-12 ENCOUNTER — Telehealth: Payer: Self-pay | Admitting: Certified Nurse Midwife

## 2017-10-12 ENCOUNTER — Telehealth: Payer: Self-pay

## 2017-10-12 NOTE — Telephone Encounter (Signed)
The patient called and stated that she missed a call in regards to her fmla paperwork, and that she would like a call back. Please advise.

## 2017-10-12 NOTE — Telephone Encounter (Signed)
Spoke with pt- she would like to start her leave 10/16/17 due to her having to travel for her job and she states its getting to be too much for her. Asked her to get her disability papers and drop them off. She was also reminded there is a $25 fee associated with filling those papers out. Pt expressed understanding.

## 2017-10-12 NOTE — Telephone Encounter (Signed)
Attempted to contact pt- unable to leave a message due to mailbox not being setup. Why does she want to be out early? And I will need her to get me the paperwork and pay the fee.

## 2017-10-12 NOTE — Telephone Encounter (Signed)
The patient stated that [name] Castro and/or Pattricia Bossnnie said she could go out 1 month prior to delivery?..due to to her 1.5 hour drive to work one way.  She stated the leave would begin June 22 if approved by either of them.  The patient knows now that [name] Castro is out on leave.  She was also informed of the $25 fee that has to be paid prior to paperwork.  She can be reached at [phone].  Please advise, thanks.

## 2017-10-15 ENCOUNTER — Telehealth: Payer: Self-pay

## 2017-10-15 ENCOUNTER — Telehealth: Payer: Self-pay | Admitting: Certified Nurse Midwife

## 2017-10-15 NOTE — Telephone Encounter (Signed)
Attempted to return pts call- no answer and mailbox is full- unable to leave message.

## 2017-10-15 NOTE — Telephone Encounter (Signed)
The patient called and stated that she needs to speak with her nurse in regards to some questions and concerns she has, Please advise.

## 2017-10-17 ENCOUNTER — Other Ambulatory Visit: Payer: Self-pay

## 2017-10-17 ENCOUNTER — Inpatient Hospital Stay: Payer: BLUE CROSS/BLUE SHIELD | Admitting: Anesthesiology

## 2017-10-17 ENCOUNTER — Inpatient Hospital Stay: Payer: BLUE CROSS/BLUE SHIELD

## 2017-10-17 ENCOUNTER — Inpatient Hospital Stay
Admission: EM | Admit: 2017-10-17 | Discharge: 2017-10-21 | DRG: 786 | Disposition: A | Discharge status: Home / Self Care | Payer: BLUE CROSS/BLUE SHIELD | Attending: Certified Nurse Midwife | Admitting: Certified Nurse Midwife

## 2017-10-17 DIAGNOSIS — O1414 Severe pre-eclampsia complicating childbirth: Secondary | ICD-10-CM | POA: Diagnosis present

## 2017-10-17 DIAGNOSIS — O4103X0 Oligohydramnios, third trimester, not applicable or unspecified: Secondary | ICD-10-CM | POA: Diagnosis present

## 2017-10-17 DIAGNOSIS — O9902 Anemia complicating childbirth: Secondary | ICD-10-CM | POA: Diagnosis present

## 2017-10-17 DIAGNOSIS — O45023 Premature separation of placenta with disseminated intravascular coagulation, third trimester: Secondary | ICD-10-CM | POA: Diagnosis present

## 2017-10-17 DIAGNOSIS — Z3A35 35 weeks gestation of pregnancy: Secondary | ICD-10-CM | POA: Diagnosis not present

## 2017-10-17 DIAGNOSIS — O364XX0 Maternal care for intrauterine death, not applicable or unspecified: Principal | ICD-10-CM | POA: Diagnosis present

## 2017-10-17 DIAGNOSIS — O4593 Premature separation of placenta, unspecified, third trimester: Secondary | ICD-10-CM | POA: Diagnosis not present

## 2017-10-17 DIAGNOSIS — D649 Anemia, unspecified: Secondary | ICD-10-CM | POA: Diagnosis present

## 2017-10-17 DIAGNOSIS — Z3A36 36 weeks gestation of pregnancy: Secondary | ICD-10-CM | POA: Diagnosis not present

## 2017-10-17 DIAGNOSIS — O1494 Unspecified pre-eclampsia, complicating childbirth: Secondary | ICD-10-CM | POA: Diagnosis not present

## 2017-10-17 LAB — COMPREHENSIVE METABOLIC PANEL
ALBUMIN: 3.2 g/dL — AB (ref 3.5–5.0)
ALK PHOS: 293 U/L — AB (ref 38–126)
ALT: 13 U/L — ABNORMAL LOW (ref 14–54)
ANION GAP: 9 (ref 5–15)
AST: 23 U/L (ref 15–41)
BILIRUBIN TOTAL: 0.7 mg/dL (ref 0.3–1.2)
BUN: 11 mg/dL (ref 6–20)
CALCIUM: 9 mg/dL (ref 8.9–10.3)
CO2: 20 mmol/L — ABNORMAL LOW (ref 22–32)
CREATININE: 1.02 mg/dL — AB (ref 0.44–1.00)
Chloride: 106 mmol/L (ref 101–111)
GFR calc Af Amer: >60 mL/min (ref >60)
GFR calc non Af Amer: >60 mL/min (ref >60)
Glucose, Bld: 124 mg/dL — ABNORMAL HIGH (ref 65–99)
Potassium: 3.5 mmol/L (ref 3.5–5.1)
Sodium: 135 mmol/L (ref 135–145)
TOTAL PROTEIN: 6.5 g/dL (ref 6.5–8.1)

## 2017-10-17 LAB — PROTIME-INR
INR: 1.12
PROTHROMBIN TIME: 14.3 s (ref 11.4–15.2)

## 2017-10-17 LAB — CBC
HEMATOCRIT: 34.5 % — AB (ref 35.0–47.0)
HEMOGLOBIN: 12.1 g/dL (ref 12.0–16.0)
MCH: 31.5 pg (ref 26.0–34.0)
MCHC: 35 g/dL (ref 32.0–36.0)
MCV: 90.2 fL (ref 80.0–100.0)
Platelets: 124 10*3/uL — ABNORMAL LOW (ref 150–440)
RBC: 3.83 MIL/uL (ref 3.80–5.20)
RDW: 14.8 % — AB (ref 11.5–14.5)
WBC: 9 10*3/uL (ref 3.6–11.0)

## 2017-10-17 LAB — KLEIHAUER-BETKE STAIN
Fetal Cells %: 0 %
Quantitation Fetal Hemoglobin: 0 mL

## 2017-10-17 LAB — APTT: APTT: 29 s (ref 24–36)

## 2017-10-17 LAB — FIBRINOGEN: Fibrinogen: 216 mg/dL (ref 210–475)

## 2017-10-17 MED ORDER — LACTATED RINGERS IV SOLN
500.0000 mL | INTRAVENOUS | Status: DC | PRN
  Administered 2017-10-17: 1000 mL via INTRAVENOUS

## 2017-10-17 MED ORDER — TERBUTALINE SULFATE 1 MG/ML IJ SOLN
INTRAMUSCULAR | Status: AC
  Filled 2017-10-17: qty 1

## 2017-10-17 MED ORDER — FENTANYL CITRATE (PF) 100 MCG/2ML IJ SOLN: 50.0000 ug | INTRAMUSCULAR | Status: DC | PRN

## 2017-10-17 MED ORDER — BUTORPHANOL TARTRATE 2 MG/ML IJ SOLN: 1.0000 mg | INTRAMUSCULAR | Status: DC | PRN

## 2017-10-17 MED ORDER — AMMONIA AROMATIC IN INHA
RESPIRATORY_TRACT | Status: AC
  Filled 2017-10-17: qty 10

## 2017-10-17 MED ORDER — ONDANSETRON HCL 4 MG/2ML IJ SOLN
4.0000 mg | Freq: Four times a day (QID) | INTRAMUSCULAR | Status: DC | PRN
  Administered 2017-10-17: 4 mg via INTRAVENOUS
  Filled 2017-10-17: qty 2

## 2017-10-17 MED ORDER — FENTANYL CITRATE (PF) 100 MCG/2ML IJ SOLN
50.0000 ug | Freq: Once | INTRAMUSCULAR | Status: AC
  Administered 2017-10-17: 50 ug via INTRAVENOUS

## 2017-10-17 MED ORDER — OXYTOCIN 10 UNIT/ML IJ SOLN
INTRAMUSCULAR | Status: AC
  Filled 2017-10-17: qty 2

## 2017-10-17 MED ORDER — OXYTOCIN BOLUS FROM INFUSION: 500.0000 mL | Freq: Once | INTRAVENOUS | Status: DC

## 2017-10-17 MED ORDER — OXYTOCIN 40 UNITS IN LACTATED RINGERS INFUSION - SIMPLE MED
2.5000 [IU]/h | INTRAVENOUS | Status: DC
  Administered 2017-10-18: 1000 mL via INTRAVENOUS
  Filled 2017-10-17 (×2): qty 1000

## 2017-10-17 MED ORDER — OXYTOCIN 40 UNITS IN LACTATED RINGERS INFUSION - SIMPLE MED
INTRAVENOUS | Status: AC
  Filled 2017-10-17: qty 1000

## 2017-10-17 MED ORDER — LIDOCAINE HCL (PF) 1 % IJ SOLN
INTRAMUSCULAR | Status: DC | PRN
  Administered 2017-10-17: 4 mL via SUBCUTANEOUS

## 2017-10-17 MED ORDER — FENTANYL 2.5 MCG/ML W/ROPIVACAINE 0.15% IN NS 100 ML EPIDURAL (ARMC)
EPIDURAL | Status: DC | PRN
  Administered 2017-10-17: 12 mL/h via EPIDURAL

## 2017-10-17 MED ORDER — MISOPROSTOL 200 MCG PO TABS
ORAL_TABLET | ORAL | Status: AC
  Filled 2017-10-17: qty 4

## 2017-10-17 MED ORDER — MISOPROSTOL 200 MCG PO TABS
200.0000 ug | ORAL_TABLET | ORAL | Status: DC
  Administered 2017-10-17: 200 ug via VAGINAL
  Filled 2017-10-17 (×2): qty 1

## 2017-10-17 MED ORDER — LIDOCAINE-EPINEPHRINE (PF) 1.5 %-1:200000 IJ SOLN
INTRAMUSCULAR | Status: DC | PRN
  Administered 2017-10-17: 3 mL via EPIDURAL

## 2017-10-17 MED ORDER — LIDOCAINE HCL (PF) 1 % IJ SOLN
INTRAMUSCULAR | Status: AC
  Filled 2017-10-17: qty 30

## 2017-10-17 MED ORDER — SODIUM CHLORIDE 0.9 % IV SOLN
INTRAVENOUS | Status: DC | PRN
  Administered 2017-10-17 (×2): 5 mL via EPIDURAL

## 2017-10-17 MED ORDER — SODIUM CHLORIDE FLUSH 0.9 % IV SOLN
INTRAVENOUS | Status: AC
  Filled 2017-10-17: qty 40

## 2017-10-17 MED ORDER — FENTANYL 2.5 MCG/ML W/ROPIVACAINE 0.15% IN NS 100 ML EPIDURAL (ARMC)
EPIDURAL | Status: AC
  Filled 2017-10-17: qty 100

## 2017-10-17 MED ORDER — LACTATED RINGERS IV SOLN
INTRAVENOUS | Status: DC
  Administered 2017-10-17 – 2017-10-18 (×2): via INTRAVENOUS

## 2017-10-17 MED ORDER — FENTANYL CITRATE (PF) 100 MCG/2ML IJ SOLN
INTRAMUSCULAR | Status: AC
  Administered 2017-10-18: 25 ug via INTRAVENOUS
  Filled 2017-10-17: qty 2

## 2017-10-17 NOTE — H&P (Addendum)
  History and Physical   HPI  [name] Castro is a 27 y.o. G1P0 at 5849w6d Estimated Date of Delivery: 11/15/17 who is being admitted for fetal demise and placental abruption. PT states that around 4 pm this evening she had a sudden onset of significant pain. She last felt fetal movement around that time. She denies bleeding or loss of fluid.    OB History  OB History  Gravida Para Term Preterm AB Living  1 0 0 0 0 0  SAB TAB Ectopic Multiple Live Births  0 0 0 0 0    # Outcome Date GA Lbr Len/2nd Weight Sex Delivery Anes PTL Lv  1 Current             PROBLEM LIST  Pregnancy complications or risks: Patient Active Problem List   Diagnosis Date Noted  . Labor and delivery, indication for care 10/17/2017    Prenatal labs and studies: ABO, Rh: O/Positive/-- (01/18 1605) Antibody: Negative (01/18 1605) Rubella: 5.13 (01/18 1605) RPR: Non Reactive (05/02 0929)  HBsAg: Negative (01/18 1605)  HIV: Non Reactive (01/18 1605)  GBS: unknown   No past medical history on file.   Past Surgical History:  Procedure Laterality Date  . ANKLE SURGERY       Medications    Current Discharge Medication List    CONTINUE these medications which have NOT CHANGED   Details  Prenatal Vit-Fe Fumarate-FA (PRENATAL MULTIVITAMIN) TABS tablet Take 1 tablet by mouth daily at 12 noon.         Allergies  Patient has no known allergies.  Review of Systems  Constitutional: negative Eyes: negative Ears, nose, mouth, throat, and face: negative Respiratory: negative Cardiovascular: negative Gastrointestinal: negative Genitourinary:negative Integument/breast: negative Hematologic/lymphatic: negative Musculoskeletal:negative Neurological: negative Behavioral/Psych: negative, appropriate  Endocrine: negative Allergic/Immunologic: negative  Physical Exam  LMP 02/08/2017 (Exact Date)   Lungs:  CTA B Cardio: RRR  Abd: Soft, gravid, NT Presentation: cephalic EXT: No C/C/ 2+ Edema  pitting  DTRs: 2+ B CERVIX: 1 cm  :  70%:   -3:    mid position:    soft  See Prenatal records for more detailed PE.     FHR:  Fetal demise , bedside ultrasound by Dr. Pauletta BrownsStabler   Toco: Uterine Contractions: irritability q 1 min, palpates firm     Test Results  No results found for this or any previous visit (from the past 24 hour(s)). Other:  GBS unknown   Assessment   G1P0 at 5049w6d Estimated Date of Delivery: 11/15/17    Patient Active Problem List   Diagnosis Date Noted  . Labor and delivery, indication for care 10/17/2017    Plan  1. Admit to L&D :   Plan for vaginal delivery, Cytotec  2. EFM:n/a 3. Epidural if desired.  4. Admission labs  5. OB ultrasound   Dr.Cherry notified and collaborating on plan of care  Doreene Burkennie Sabrie Moritz, [place]  10/17/2017 6:59 PM

## 2017-10-17 NOTE — Anesthesia Preprocedure Evaluation (Addendum)
Anesthesia Evaluation  Patient identified by MRN, date of birth, ID band Patient awake    Reviewed: Allergy & Precautions, H&P , NPO status , Patient's Chart, lab work & pertinent test results, reviewed documented beta blocker date and time   History of Anesthesia Complications Negative for: history of anesthetic complications  Airway Mallampati: II  TM Distance: >3 FB Neck ROM: full    Dental no notable dental hx.    Pulmonary neg pulmonary ROS,    Pulmonary exam normal        Cardiovascular Exercise Tolerance: Good negative cardio ROS Normal cardiovascular exam     Neuro/Psych negative neurological ROS  negative psych ROS   GI/Hepatic negative GI ROS, Neg liver ROS,   Endo/Other  negative endocrine ROS  Renal/GU negative Renal ROS  negative genitourinary   Musculoskeletal   Abdominal   Peds  Hematology  (+) Blood dyscrasia, ,   Anesthesia Other Findings PreE Fetal demise ?ITP Active MTP   Reproductive/Obstetrics (+) Pregnancy IUFD at 35 weeks.                            Anesthesia Physical Anesthesia Plan  ASA: III  Anesthesia Plan: Epidural   Post-op Pain Management:    Induction:   PONV Risk Score and Plan:   Airway Management Planned:   Additional Equipment:   Intra-op Plan:   Post-operative Plan:   Informed Consent: I have reviewed the patients History and Physical, chart, labs and discussed the procedure including the risks, benefits and alternatives for the proposed anesthesia with the patient or authorized representative who has indicated his/her understanding and acceptance.   Dental Advisory Given  Plan Discussed with: Anesthesiologist, CRNA and Surgeon  Anesthesia Plan Comments: (Patient with an IUFD at [redacted] weeks gestation.  Plts and WBC are wnl currently; however, plt are trending down, so told nursing to get another plt count s/p delivery to check that  they are WNL prior to removing the epidural.)       Anesthesia Quick Evaluation

## 2017-10-17 NOTE — Progress Notes (Signed)
LABOR NOTE   [name] Castro 27 y.o.GP@ at 6320w6d Not in labor.  SUBJECTIVE:  Feeling better with epidural.  OBJECTIVE:  BP 127/71   Pulse 77   Temp 97.8 F (36.6 C) (Oral)   Resp 20   Ht 5\' 5"  (1.651 m)   Wt 217 lb (98.4 kg)   LMP 02/08/2017 (Exact Date)   SpO2 100%   BMI 36.11 kg/m  Total I/O In: 1143.8 [I.V.:1143.8] Out: -   She has not shown cervical change. CERVIX: 2 cm:  70%:   -2:   mid position:   Soft , small amount of bright red bleeding noted on exam  SVE:   Dilation: 2 Effacement (%): 70 Station: -2 Exam by:: Janee Mornhompson A. CNM CONTRACTIONS: irritability  FHR: absent, IUFD   Analgesia: Epidural  Labs: Lab Results  Component Value Date   WBC 9.0 10/17/2017   HGB 12.1 10/17/2017   HCT 34.5 (L) 10/17/2017   MCV 90.2 10/17/2017   PLT 124 (L) 10/17/2017    ASSESSMENT: 1) Labor curve reviewed.       Progress: Not in labor.     Membranes: intact        Active Problems:   Labor and delivery, indication for care   PLAN: Cytotec placed   Doreene Burkennie Nuno Brubacher, CNM  10/17/2017 10:12 PM

## 2017-10-17 NOTE — Anesthesia Procedure Notes (Signed)
Epidural Patient location during procedure: OB Start time: 10/17/2017 9:06 PM End time: 10/17/2017 9:23 PM  Staffing Anesthesiologist: Lenard SimmerKarenz, Garret Teale, MD Performed: anesthesiologist   Preanesthetic Checklist Completed: patient identified, site marked, surgical consent, pre-op evaluation, timeout performed, IV checked, risks and benefits discussed and monitors and equipment checked  Epidural Patient position: sitting Prep: ChloraPrep Patient monitoring: heart rate, continuous pulse ox and blood pressure Approach: midline Location: L3-L4 Injection technique: LOR saline  Needle:  Needle type: Tuohy  Needle gauge: 17 G Needle length: 9 cm and 9 Needle insertion depth: 4.5 cm Catheter type: closed end flexible Catheter size: 19 Gauge Catheter at skin depth: 9 cm Test dose: negative and 1.5% lidocaine with Epi 1:200 K  Assessment Sensory level: T10 Events: blood not aspirated, injection not painful, no injection resistance, negative IV test and no paresthesia  Additional Notes 2nd attempt Pt. Evaluated and documentation done after procedure finished. Patient identified. Risks/Benefits/Options discussed with patient including but not limited to bleeding, infection, nerve damage, paralysis, failed block, incomplete pain control, headache, blood pressure changes, nausea, vomiting, reactions to medication both or allergic, itching and postpartum back pain. Confirmed with bedside nurse the patient's most recent platelet count. Confirmed with patient that they are not currently taking any anticoagulation, have any bleeding history or any family history of bleeding disorders. Patient expressed understanding and wished to proceed. All questions were answered. Sterile technique was used throughout the entire procedure. Please see nursing notes for vital signs. Test dose was given through epidural catheter and negative prior to continuing to dose epidural or start infusion. Warning signs of high  block given to the patient including shortness of breath, tingling/numbness in hands, complete motor block, or any concerning symptoms with instructions to call for help. Patient was given instructions on fall risk and not to get out of bed. All questions and concerns addressed with instructions to call with any issues or inadequate analgesia.   Patient tolerated the insertion well without immediate complications.Reason for block:procedure for pain

## 2017-10-17 NOTE — Progress Notes (Signed)
   10/17/17 1858  Clinical Encounter Type  Visited With Patient not available  Visit Type Initial (OR: Fetal demise, father of baby requesting prayer)  Referral From Nurse  Consult/Referral To Chaplain  Spiritual Encounters  Spiritual Needs Prayer;Emotional;Grief support  Stress Factors  Patient Stress Factors Loss  Family Stress Factors Loss   CH responded to OR for fetal demise and father of infant requesting prayer.  CH checked in with staff, asked how staff was doing, and discussed patient's situation.  No fetal heartbeat was detected.  Nurse encouraged CH to come back in about 20 minutes since pt was being stabilized now. CH will follow-up.

## 2017-10-17 NOTE — Progress Notes (Signed)
   10/17/17 1945  Clinical Encounter Type  Visited With Patient and family together  Visit Type Follow-up  Referral From Nurse  Consult/Referral To Chaplain  Spiritual Encounters  Spiritual Needs Prayer;Emotional;Grief support  Stress Factors  Patient Stress Factors Loss  Family Stress Factors Loss   CH followed up with pt.  CH checked in with nurses, who just came on shift.  Pt's nurse introduced CH to pt and pt's partner, and partner's mother and sister who were present in the room.  CH maintained pastoral presence.  Father of child asked for prayer.  CH lead prayer as we all gathered around patient's beside holding hands. CH prayed and others prayed silently, all tearful.  Maintained pastoral presence.  CH was interrupted by 8pm page.  CH returned and continued to maintain presence. Before leaving room, CH asked father if I could do anything else for them. He requested prayer again and asked for pt to be okay.  CH prayed for a safe delivery of son Rosalyn GessGrayson and the health and strength of pt and family. CH told family to ask nurse to [facility] if they needed anything else. CH left and checked in with nurses at station before leaving.  Mother of child's father came to nurse's station tearful and asking what had happened. Nurses comforted and helped answer questions.

## 2017-10-18 ENCOUNTER — Encounter: Payer: Self-pay | Admitting: Anesthesiology

## 2017-10-18 ENCOUNTER — Encounter
Admission: EM | Disposition: A | Discharge status: Home / Self Care | Payer: Self-pay | Source: Home / Self Care | Attending: Certified Nurse Midwife

## 2017-10-18 DIAGNOSIS — O364XX0 Maternal care for intrauterine death, not applicable or unspecified: Principal | ICD-10-CM

## 2017-10-18 DIAGNOSIS — Z3A36 36 weeks gestation of pregnancy: Secondary | ICD-10-CM

## 2017-10-18 DIAGNOSIS — O4593 Premature separation of placenta, unspecified, third trimester: Secondary | ICD-10-CM

## 2017-10-18 DIAGNOSIS — O1494 Unspecified pre-eclampsia, complicating childbirth: Secondary | ICD-10-CM

## 2017-10-18 LAB — COMPREHENSIVE METABOLIC PANEL
ALBUMIN: 2.3 g/dL — AB (ref 3.5–5.0)
ALK PHOS: 177 U/L — AB (ref 38–126)
ALT: 12 U/L — AB (ref 14–54)
ALT: 10 U/L — ABNORMAL LOW (ref 14–54)
AST: 37 U/L (ref 15–41)
AST: 29 U/L (ref 15–41)
Albumin: 2.7 g/dL — ABNORMAL LOW (ref 3.5–5.0)
Alkaline Phosphatase: 298 U/L — ABNORMAL HIGH (ref 38–126)
Anion gap: 10 (ref 5–15)
Anion gap: 7 (ref 5–15)
BUN: 15 mg/dL (ref 6–20)
BUN: 13 mg/dL (ref 6–20)
CALCIUM: 7.1 mg/dL — AB (ref 8.9–10.3)
CO2: 19 mmol/L — AB (ref 22–32)
CO2: 23 mmol/L (ref 22–32)
CREATININE: 1.12 mg/dL — AB (ref 0.44–1.00)
Calcium: 7.9 mg/dL — ABNORMAL LOW (ref 8.9–10.3)
Chloride: 105 mmol/L (ref 101–111)
Chloride: 105 mmol/L (ref 101–111)
Creatinine, Ser: 1.24 mg/dL — ABNORMAL HIGH (ref 0.44–1.00)
GFR calc Af Amer: >60 mL/min (ref >60)
GFR calc non Af Amer: >60 mL/min (ref >60)
GFR, EST NON AFRICAN AMERICAN: 59 mL/min — AB (ref >60)
GLUCOSE: 122 mg/dL — AB (ref 65–99)
Glucose, Bld: 97 mg/dL (ref 65–99)
POTASSIUM: 4.2 mmol/L (ref 3.5–5.1)
Potassium: 4.3 mmol/L (ref 3.5–5.1)
SODIUM: 134 mmol/L — AB (ref 135–145)
SODIUM: 135 mmol/L (ref 135–145)
TOTAL PROTEIN: 4.6 g/dL — AB (ref 6.5–8.1)
Total Bilirubin: 0.8 mg/dL (ref 0.3–1.2)
Total Bilirubin: 0.9 mg/dL (ref 0.3–1.2)
Total Protein: 5.3 g/dL — ABNORMAL LOW (ref 6.5–8.1)

## 2017-10-18 LAB — MASSIVE TRANSFUSION PROTOCOL ORDER (BLOOD BANK NOTIFICATION)

## 2017-10-18 LAB — URINE DRUG SCREEN, QUALITATIVE (ARMC ONLY)
Amphetamines, Ur Screen: NOT DETECTED
Benzodiazepine, Ur Scrn: NOT DETECTED
Cannabinoid 50 Ng, Ur ~~LOC~~: NOT DETECTED
Cocaine Metabolite,Ur ~~LOC~~: NOT DETECTED
MDMA (ECSTASY) UR SCREEN: NOT DETECTED
Methadone Scn, Ur: NOT DETECTED
Opiate, Ur Screen: NOT DETECTED
PHENCYCLIDINE (PCP) UR S: NOT DETECTED
Tricyclic, Ur Screen: NOT DETECTED

## 2017-10-18 LAB — FIBRINOGEN
FIBRINOGEN: 285 mg/dL (ref 210–475)
Fibrinogen: 145 mg/dL — ABNORMAL LOW (ref 210–475)
Fibrinogen: 329 mg/dL (ref 210–475)

## 2017-10-18 LAB — CBC WITH DIFFERENTIAL/PLATELET
Basophils Absolute: 0 10*3/uL (ref 0–0.1)
Basophils Relative: 0 %
Eosinophils Absolute: 0 10*3/uL (ref 0–0.7)
Eosinophils Relative: 0 %
HCT: 17 % — ABNORMAL LOW (ref 35.0–47.0)
Hemoglobin: 5.7 g/dL — ABNORMAL LOW (ref 12.0–16.0)
LYMPHS ABS: 0.8 10*3/uL — AB (ref 1.0–3.6)
LYMPHS PCT: 4 %
MCH: 31.2 pg (ref 26.0–34.0)
MCHC: 33.6 g/dL (ref 32.0–36.0)
MCV: 92.8 fL (ref 80.0–100.0)
MONO ABS: 1.3 10*3/uL — AB (ref 0.2–0.9)
MONOS PCT: 7 %
Neutro Abs: 16.4 10*3/uL — ABNORMAL HIGH (ref 1.4–6.5)
Neutrophils Relative %: 89 %
Platelets: 40 10*3/uL — ABNORMAL LOW (ref 150–440)
RBC: 1.84 MIL/uL — ABNORMAL LOW (ref 3.80–5.20)
RDW: 14.1 % (ref 11.5–14.5)
WBC: 18.6 10*3/uL — ABNORMAL HIGH (ref 3.6–11.0)

## 2017-10-18 LAB — CBC
HCT: 21.5 % — ABNORMAL LOW (ref 35.0–47.0)
HEMATOCRIT: 21.9 % — AB (ref 35.0–47.0)
HEMOGLOBIN: 7.9 g/dL — AB (ref 12.0–16.0)
Hemoglobin: 7.2 g/dL — ABNORMAL LOW (ref 12.0–16.0)
MCH: 30.4 pg (ref 26.0–34.0)
MCH: 32.2 pg (ref 26.0–34.0)
MCHC: 33.4 g/dL (ref 32.0–36.0)
MCHC: 36.1 g/dL — ABNORMAL HIGH (ref 32.0–36.0)
MCV: 91.1 fL (ref 80.0–100.0)
MCV: 89 fL (ref 80.0–100.0)
PLATELETS: 60 10*3/uL — AB (ref 150–440)
Platelets: 106 10*3/uL — ABNORMAL LOW (ref 150–440)
RBC: 2.36 MIL/uL — AB (ref 3.80–5.20)
RBC: 2.46 MIL/uL — ABNORMAL LOW (ref 3.80–5.20)
RDW: 14.6 % — ABNORMAL HIGH (ref 11.5–14.5)
RDW: 14.7 % — AB (ref 11.5–14.5)
WBC: 15.7 10*3/uL — AB (ref 3.6–11.0)
WBC: 14.1 10*3/uL — AB (ref 3.6–11.0)

## 2017-10-18 LAB — ABO/RH: ABO/RH(D): O POS

## 2017-10-18 LAB — PROTIME-INR
INR: 1.24
INR: 1.18
INR: 1.07
PROTHROMBIN TIME: 14.9 s (ref 11.4–15.2)
Prothrombin Time: 15.5 seconds — ABNORMAL HIGH (ref 11.4–15.2)
Prothrombin Time: 13.8 seconds (ref 11.4–15.2)

## 2017-10-18 LAB — APTT
APTT: 27 s (ref 24–36)
aPTT: 28 seconds (ref 24–36)
aPTT: 26 seconds (ref 24–36)

## 2017-10-18 LAB — PROTEIN / CREATININE RATIO, URINE
CREATININE, URINE: 585 mg/dL
PROTEIN CREATININE RATIO: 1.26 mg/mg{creat} — AB (ref 0.00–0.15)
TOTAL PROTEIN, URINE: 739 mg/dL

## 2017-10-18 LAB — MAGNESIUM: Magnesium: 6.7 mg/dL (ref 1.7–2.4)

## 2017-10-18 LAB — PREPARE RBC (CROSSMATCH)

## 2017-10-18 SURGERY — Surgical Case
Anesthesia: Epidural | Wound class: Clean Contaminated

## 2017-10-18 MED ORDER — ONDANSETRON HCL 4 MG/2ML IJ SOLN
INTRAMUSCULAR | Status: AC
  Filled 2017-10-18: qty 2

## 2017-10-18 MED ORDER — MAGNESIUM SULFATE 40 G IN LACTATED RINGERS - SIMPLE
INTRAVENOUS | Status: AC
  Filled 2017-10-18: qty 500

## 2017-10-18 MED ORDER — TRANEXAMIC ACID 1000 MG/10ML IV SOLN
1000.0000 mg | INTRAVENOUS | Status: DC
  Filled 2017-10-18: qty 10

## 2017-10-18 MED ORDER — MIDAZOLAM HCL 2 MG/2ML IJ SOLN
INTRAMUSCULAR | Status: DC | PRN
  Administered 2017-10-18: 2 mg via INTRAVENOUS
  Administered 2017-10-18: 3 mg via INTRAVENOUS

## 2017-10-18 MED ORDER — ZOLPIDEM TARTRATE 5 MG PO TABS: 5.0000 mg | ORAL_TABLET | Freq: Every evening | ORAL | Status: DC | PRN

## 2017-10-18 MED ORDER — CARBOPROST TROMETHAMINE 250 MCG/ML IM SOLN
INTRAMUSCULAR | Status: AC
  Filled 2017-10-18: qty 1

## 2017-10-18 MED ORDER — LACTATED RINGERS IV SOLN: INTRAVENOUS | Status: DC

## 2017-10-18 MED ORDER — LIDOCAINE 2% (20 MG/ML) 5 ML SYRINGE: INTRAMUSCULAR | Status: DC | PRN

## 2017-10-18 MED ORDER — COCONUT OIL OIL: 1.0000 "application " | TOPICAL_OIL | Status: DC | PRN

## 2017-10-18 MED ORDER — EPHEDRINE 5 MG/ML INJ: 10.0000 mg | INTRAVENOUS | Status: DC | PRN

## 2017-10-18 MED ORDER — LACTATED RINGERS IV SOLN
500.0000 mL | Freq: Once | INTRAVENOUS | Status: DC
  Administered 2017-10-18: 500 mL via INTRAVENOUS

## 2017-10-18 MED ORDER — ONDANSETRON HCL 4 MG/2ML IJ SOLN
INTRAMUSCULAR | Status: DC | PRN
  Administered 2017-10-18: 4 mg via INTRAVENOUS

## 2017-10-18 MED ORDER — PHENYLEPHRINE 40 MCG/ML (10ML) SYRINGE FOR IV PUSH (FOR BLOOD PRESSURE SUPPORT)
80.0000 ug | PREFILLED_SYRINGE | INTRAVENOUS | Status: DC | PRN
Start: 2017-10-18 — End: 2017-10-18

## 2017-10-18 MED ORDER — FAMOTIDINE IN NACL 20-0.9 MG/50ML-% IV SOLN
20.0000 mg | Freq: Two times a day (BID) | INTRAVENOUS | Status: DC
  Administered 2017-10-18 – 2017-10-19 (×3): 20 mg via INTRAVENOUS
  Filled 2017-10-18 (×5): qty 50

## 2017-10-18 MED ORDER — DIPHENHYDRAMINE HCL 50 MG/ML IJ SOLN: 12.5000 mg | INTRAMUSCULAR | Status: DC | PRN

## 2017-10-18 MED ORDER — OXYCODONE HCL 5 MG PO TABS
10.0000 mg | ORAL_TABLET | ORAL | Status: DC | PRN
  Administered 2017-10-20 (×4): 10 mg via ORAL
  Filled 2017-10-18 (×4): qty 2

## 2017-10-18 MED ORDER — SODIUM CHLORIDE 0.9% IV SOLUTION: Freq: Once | INTRAVENOUS | Status: DC

## 2017-10-18 MED ORDER — OXYCODONE HCL 5 MG PO TABS
5.0000 mg | ORAL_TABLET | ORAL | Status: DC | PRN
  Administered 2017-10-19 – 2017-10-21 (×2): 5 mg via ORAL
  Filled 2017-10-18 (×2): qty 1

## 2017-10-18 MED ORDER — ACETAMINOPHEN 500 MG PO TABS
1000.0000 mg | ORAL_TABLET | Freq: Once | ORAL | Status: AC
  Administered 2017-10-18: 1000 mg via ORAL
  Filled 2017-10-18: qty 2

## 2017-10-18 MED ORDER — WITCH HAZEL-GLYCERIN EX PADS: 1.0000 "application " | MEDICATED_PAD | CUTANEOUS | Status: DC | PRN

## 2017-10-18 MED ORDER — FENTANYL 2.5 MCG/ML W/ROPIVACAINE 0.15% IN NS 100 ML EPIDURAL (ARMC)
12.0000 mL/h | EPIDURAL | Status: DC
  Administered 2017-10-18 (×2): 12 mL/h via EPIDURAL
  Filled 2017-10-18 (×2): qty 100

## 2017-10-18 MED ORDER — PRENATAL MULTIVITAMIN CH
1.0000 | ORAL_TABLET | Freq: Every day | ORAL | Status: DC
  Administered 2017-10-20 – 2017-10-21 (×2): 1 via ORAL
  Filled 2017-10-18 (×2): qty 1

## 2017-10-18 MED ORDER — DIPHENHYDRAMINE HCL 25 MG PO CAPS: 25.0000 mg | ORAL_CAPSULE | Freq: Four times a day (QID) | ORAL | Status: DC | PRN

## 2017-10-18 MED ORDER — PHENYLEPHRINE HCL 10 MG/ML IJ SOLN
INTRAMUSCULAR | Status: DC | PRN
  Administered 2017-10-18: 100 ug via INTRAVENOUS

## 2017-10-18 MED ORDER — PHENYLEPHRINE 40 MCG/ML (10ML) SYRINGE FOR IV PUSH (FOR BLOOD PRESSURE SUPPORT): 80.0000 ug | PREFILLED_SYRINGE | INTRAVENOUS | Status: DC | PRN

## 2017-10-18 MED ORDER — ACETAMINOPHEN 500 MG PO TABS
1000.0000 mg | ORAL_TABLET | Freq: Three times a day (TID) | ORAL | Status: DC
  Administered 2017-10-19 – 2017-10-21 (×4): 1000 mg via ORAL
  Filled 2017-10-18 (×4): qty 2

## 2017-10-18 MED ORDER — LACTATED RINGERS IV SOLN
INTRAVENOUS | Status: DC | PRN
  Administered 2017-10-18: 13:00:00 via INTRAVENOUS

## 2017-10-18 MED ORDER — HYDROMORPHONE HCL 1 MG/ML IJ SOLN
1.0000 mg | INTRAMUSCULAR | Status: DC | PRN
  Administered 2017-10-19 (×4): 1 mg via INTRAVENOUS
  Filled 2017-10-18 (×2): qty 1
  Filled 2017-10-18: qty 2
  Filled 2017-10-18: qty 1

## 2017-10-18 MED ORDER — MENTHOL 3 MG MT LOZG
1.0000 | LOZENGE | OROMUCOSAL | Status: DC | PRN
  Filled 2017-10-18: qty 9

## 2017-10-18 MED ORDER — MAGNESIUM SULFATE 40 G IN LACTATED RINGERS - SIMPLE
2.0000 g/h | INTRAVENOUS | Status: DC
  Filled 2017-10-18 (×2): qty 500

## 2017-10-18 MED ORDER — MAGNESIUM HYDROXIDE 400 MG/5ML PO SUSP: 30.0000 mL | ORAL | Status: DC | PRN

## 2017-10-18 MED ORDER — MIDAZOLAM HCL 5 MG/5ML IJ SOLN
INTRAMUSCULAR | Status: AC
  Filled 2017-10-18: qty 5

## 2017-10-18 MED ORDER — METHYLERGONOVINE MALEATE 0.2 MG/ML IJ SOLN
INTRAMUSCULAR | Status: AC
  Filled 2017-10-18: qty 1

## 2017-10-18 MED ORDER — LIDOCAINE HCL (PF) 2 % IJ SOLN
INTRAMUSCULAR | Status: AC
  Filled 2017-10-18: qty 40

## 2017-10-18 MED ORDER — MISOPROSTOL 200 MCG PO TABS
ORAL_TABLET | ORAL | Status: AC
  Filled 2017-10-18: qty 5

## 2017-10-18 MED ORDER — LIDOCAINE 5 % EX PTCH
MEDICATED_PATCH | CUTANEOUS | Status: AC
  Filled 2017-10-18: qty 1

## 2017-10-18 MED ORDER — FERROUS SULFATE 325 (65 FE) MG PO TABS
325.0000 mg | ORAL_TABLET | Freq: Two times a day (BID) | ORAL | Status: DC
  Administered 2017-10-20 – 2017-10-21 (×2): 325 mg via ORAL
  Filled 2017-10-18 (×2): qty 1

## 2017-10-18 MED ORDER — MAGNESIUM SULFATE BOLUS VIA INFUSION
4.0000 g | Freq: Once | INTRAVENOUS | Status: AC
  Administered 2017-10-18: 4 g via INTRAVENOUS
  Filled 2017-10-18: qty 500

## 2017-10-18 MED ORDER — DEXAMETHASONE SODIUM PHOSPHATE 10 MG/ML IJ SOLN
INTRAMUSCULAR | Status: AC
  Filled 2017-10-18: qty 1

## 2017-10-18 MED ORDER — LABETALOL HCL 5 MG/ML IV SOLN: 20.0000 mg | INTRAVENOUS | Status: DC | PRN

## 2017-10-18 MED ORDER — SIMETHICONE 80 MG PO CHEW
80.0000 mg | CHEWABLE_TABLET | ORAL | Status: DC | PRN
  Administered 2017-10-20: 80 mg via ORAL

## 2017-10-18 MED ORDER — OXYTOCIN 40 UNITS IN LACTATED RINGERS INFUSION - SIMPLE MED
1.0000 m[IU]/min | INTRAVENOUS | Status: DC
  Administered 2017-10-18: 2 m[IU]/min via INTRAVENOUS

## 2017-10-18 MED ORDER — CEFAZOLIN SODIUM-DEXTROSE 2-4 GM/100ML-% IV SOLN
2.0000 g | Freq: Once | INTRAVENOUS | Status: AC
  Administered 2017-10-18: 2 g via INTRAVENOUS
  Filled 2017-10-18: qty 100

## 2017-10-18 MED ORDER — MAGNESIUM SULFATE 40 G IN LACTATED RINGERS - SIMPLE
2.0000 g/h | INTRAVENOUS | Status: AC
  Administered 2017-10-19: 2 g/h via INTRAVENOUS
  Filled 2017-10-18: qty 500

## 2017-10-18 MED ORDER — HYDRALAZINE HCL 20 MG/ML IJ SOLN: 10.0000 mg | Freq: Once | INTRAMUSCULAR | Status: DC | PRN

## 2017-10-18 MED ORDER — SIMETHICONE 80 MG PO CHEW
80.0000 mg | CHEWABLE_TABLET | ORAL | Status: DC
  Administered 2017-10-19 – 2017-10-20 (×2): 80 mg via ORAL
  Filled 2017-10-18 (×3): qty 1

## 2017-10-18 MED ORDER — MAGNESIUM SULFATE 4 GM/100ML IV SOLN
INTRAVENOUS | Status: AC
  Filled 2017-10-18: qty 100

## 2017-10-18 MED ORDER — TETANUS-DIPHTH-ACELL PERTUSSIS 5-2.5-18.5 LF-MCG/0.5 IM SUSP: 0.5000 mL | Freq: Once | INTRAMUSCULAR | Status: DC

## 2017-10-18 MED ORDER — SENNOSIDES-DOCUSATE SODIUM 8.6-50 MG PO TABS
2.0000 | ORAL_TABLET | ORAL | Status: DC
  Administered 2017-10-20 (×2): 2 via ORAL
  Filled 2017-10-18 (×3): qty 2

## 2017-10-18 MED ORDER — LIDOCAINE 5 % EX PTCH
MEDICATED_PATCH | CUTANEOUS | Status: DC | PRN
  Administered 2017-10-18: 1 via TRANSDERMAL

## 2017-10-18 MED ORDER — OXYTOCIN 40 UNITS IN LACTATED RINGERS INFUSION - SIMPLE MED: 2.5000 [IU]/h | INTRAVENOUS | Status: AC

## 2017-10-18 MED ORDER — SODIUM CHLORIDE 0.9% IV SOLUTION
Freq: Once | INTRAVENOUS | Status: AC
  Administered 2017-10-18: 11:00:00 via INTRAVENOUS

## 2017-10-18 MED ORDER — FENTANYL CITRATE (PF) 100 MCG/2ML IJ SOLN
INTRAMUSCULAR | Status: AC
  Filled 2017-10-18: qty 2

## 2017-10-18 MED ORDER — DIBUCAINE 1 % RE OINT: 1.0000 "application " | TOPICAL_OINTMENT | RECTAL | Status: DC | PRN

## 2017-10-18 MED ORDER — LIDOCAINE HCL (PF) 2 % IJ SOLN
INTRAMUSCULAR | Status: DC | PRN
  Administered 2017-10-18: 100 mg via INTRADERMAL
  Administered 2017-10-18 (×3): 100 mg via EPIDURAL
  Administered 2017-10-18: 100 mg via INTRADERMAL

## 2017-10-18 MED ORDER — SOD CITRATE-CITRIC ACID 500-334 MG/5ML PO SOLN
ORAL | Status: AC
  Administered 2017-10-18: 30 mL
  Filled 2017-10-18: qty 15

## 2017-10-18 MED ORDER — MEASLES, MUMPS & RUBELLA VAC ~~LOC~~ INJ
0.5000 mL | INJECTION | Freq: Once | SUBCUTANEOUS | Status: DC
  Filled 2017-10-18: qty 0.5

## 2017-10-18 MED ORDER — FENTANYL CITRATE (PF) 100 MCG/2ML IJ SOLN
25.0000 ug | INTRAMUSCULAR | Status: DC | PRN
  Administered 2017-10-18: 25 ug via INTRAVENOUS
  Administered 2017-10-18 (×2): 50 ug via INTRAVENOUS
  Administered 2017-10-18: 25 ug via INTRAVENOUS
  Filled 2017-10-18: qty 2

## 2017-10-18 MED ORDER — DIPHENHYDRAMINE HCL 25 MG PO CAPS
25.0000 mg | ORAL_CAPSULE | Freq: Once | ORAL | Status: AC
  Administered 2017-10-18: 25 mg via ORAL
  Filled 2017-10-18: qty 1

## 2017-10-18 MED ORDER — CALCIUM GLUCONATE 10 % IV SOLN
INTRAVENOUS | Status: AC
  Filled 2017-10-18: qty 10

## 2017-10-18 MED ORDER — FENTANYL CITRATE (PF) 100 MCG/2ML IJ SOLN
INTRAMUSCULAR | Status: AC
  Administered 2017-10-18: 25 ug via INTRAVENOUS
  Filled 2017-10-18: qty 2

## 2017-10-18 SURGICAL SUPPLY — 24 items
BAG COUNTER SPONGE EZ (MISCELLANEOUS) ×2 IMPLANT
CANISTER SUCT 3000ML PPV (MISCELLANEOUS) ×3 IMPLANT
CHLORAPREP W/TINT 26ML (MISCELLANEOUS) ×6 IMPLANT
COUNTER SPONGE BAG EZ (MISCELLANEOUS) ×1
DRSG TELFA 3X8 NADH (GAUZE/BANDAGES/DRESSINGS) ×3 IMPLANT
ELECT REM PT RETURN 9FT ADLT (ELECTROSURGICAL) ×3
ELECTRODE REM PT RTRN 9FT ADLT (ELECTROSURGICAL) ×1 IMPLANT
GAUZE SPONGE 4X4 12PLY STRL (GAUZE/BANDAGES/DRESSINGS) ×3 IMPLANT
GLOVE BIO SURGEON STRL SZ 6.5 (GLOVE) ×2 IMPLANT
GLOVE BIO SURGEONS STRL SZ 6.5 (GLOVE) ×1
GLOVE INDICATOR 7.0 STRL GRN (GLOVE) ×3 IMPLANT
GOWN STRL REUS W/ TWL LRG LVL3 (GOWN DISPOSABLE) ×2 IMPLANT
GOWN STRL REUS W/TWL LRG LVL3 (GOWN DISPOSABLE) ×4
KIT TURNOVER KIT A (KITS) ×3 IMPLANT
NS IRRIG 1000ML POUR BTL (IV SOLUTION) ×3 IMPLANT
PACK C SECTION AR (MISCELLANEOUS) ×3 IMPLANT
PAD OB MATERNITY 4.3X12.25 (PERSONAL CARE ITEMS) ×3 IMPLANT
PAD PREP 24X41 OB/GYN DISP (PERSONAL CARE ITEMS) ×3 IMPLANT
RTRCTR C-SECT PINK 25CM LRG (MISCELLANEOUS) ×3 IMPLANT
SUT MNCRL AB 4-0 PS2 18 (SUTURE) ×3 IMPLANT
SUT PLAIN 2 0 XLH (SUTURE) IMPLANT
SUT VIC AB 0 CT1 36 (SUTURE) ×12 IMPLANT
SUT VIC AB 3-0 SH 27 (SUTURE) ×2
SUT VIC AB 3-0 SH 27X BRD (SUTURE) ×1 IMPLANT

## 2017-10-18 NOTE — Progress Notes (Signed)
Plan of Care Taking over management for patient [name] Castro.  Patient is a 27 y.o. G1P0 at 7886w0d who presented yesterday for complaints of abdominal pain and was discovered to have a fetal demise with suspected abruption.  Ultrasound also noting oligohydramnios.  Patient was also determined to have oliguria with an elevated protein/creatine ratio and was determined to have pre-eclampsia.  She was started on magnesium sulfate for management.  Patient was started with an induction with Cytotec with cervical dilation, however after pitocin used for augmentation she had no further cervical change.  She had repeat DIC labs and pre-eclampsia labs drawn this morning, and was AROM'd with placement of an IUPC.  Labs now show that patient is progressing to DIC.  Discussion had with patient and family, regarding proceeding with C-section as she has not had any further cervical change and her condition is worsening.  Currently patient is with an epidural, to discuss with anesthesiology plan for maintaining epidural vs general anesthesia.  Platelets in route from [place], cryoprecipitate ordered.  Patient now receiving first of 2 units of PRBCs.     [name] Castro, [name] Marcy, MD [facility]

## 2017-10-18 NOTE — Progress Notes (Signed)
LABOR NOTE   [name] Castro 27 y.o.GP@ at 4226w0d Early latent labor.  SUBJECTIVE:  Comfortable with epidural, no headache or visual disturbances OBJECTIVE:  BP 108/68   Pulse 90   Temp 97.9 F (36.6 C) (Oral)   Resp 16   Ht 5\' 5"  (1.651 m)   Wt 217 lb (98.4 kg)   LMP 02/08/2017 (Exact Date)   SpO2 99%   BMI 36.11 kg/m  Total I/O In: 1143.8 [I.V.:1143.8] Out: 70 [Urine:70]  She has shown cervical change. CERVIX: 3-4cm:  70%:   -2:   mid position:   soft SVE:   Dilation: 3.5 Effacement (%): 70 Station: -2 Exam by:: Janee Mornhompson, CNM CONTRACTIONS: irritatbility FHR: absent   Analgesia: Epidural  Labs: Lab Results  Component Value Date   WBC 9.0 10/17/2017   HGB 12.1 10/17/2017   HCT 34.5 (L) 10/17/2017   MCV 90.2 10/17/2017   PLT 124 (L) 10/17/2017   PC ratio 1. 26   ASSESSMENT: 1) Labor curve reviewed.       Progress: Early latent labor.     Membranes: intact       Active Problems:   Labor and delivery, indication for care Pre eclampsia IUFD  PLAN: Dr.Cherry consulted on plan of care  Start magnesium sulfate 4 g bolus then 2 g/hr Start pitocin per protocol  Continue to monitor I &O's  Doreene Burkennie Howard Bunte, CNM   10/18/2017 2:22 AM

## 2017-10-18 NOTE — Progress Notes (Signed)
Chaplain received referral from [facility] regarding care and support of the patient and her family in the loss of her unborn son. Chaplain Grandville Silos briefed chaplain about the engagement with and spiritual care and grief support provided. Chaplain Grandville Silos indicated that patient's husband had had an opportunity to see his infant son, which was incredibly and understandably difficult him. Chaplain Hunter introduced [name] Castro to the patient's aunt and the baby's paternal grandmother. The family is sad and tearful, but appear to be supportive of one another and the patient. [name] Castro spoke to the patient and family again before departing, providing words of comfort in the wake of their immense loss. [name] Castro talked with patient's aunt and baby's paternal grandmother evoking stories of remembrance in how the patient and her partner met.  They indicated that patient's network of family and friends is extensive and supportive. Chaplain asked the family if she could provide any support to them; they said they were just praying and would definitely need support when [name] Castro, the infant's father returned. The patient was planning to wait for him to see the baby. Family said they would [facility] chaplain when [name] Castro arrived and they were ready to see the baby. Chaplain will continue to monitor closely.    10/18/17 1628  Clinical Encounter Type  Visited With Patient and family together  Visit Type Follow-up  Referral From Chaplain  Spiritual Encounters  Spiritual Needs Emotional;Grief support  Stress Factors  Patient Stress Factors Loss  Family Stress Factors Major life changes

## 2017-10-18 NOTE — Progress Notes (Signed)
Postpartum Day # 0: Cesarean Delivery of 36 week IUFD (secondary to abruption), DIC, severe pre-eclampsia  Subjective: Patient reports complaints of chest pain/possibly epigastric pain. Feels sharp in her chest. Hurts to take a deep breath.     Objective: Vital signs in last 24 hours: Temp:  [97.5 F (36.4 C)-99.4 F (37.4 C)] 99.4 F (37.4 C) (06/24 1915) Pulse Rate:  [75-120] 104 (06/24 2005) Resp:  [13-37] 20 (06/24 1915) BP: (81-143)/(46-96) 122/73 (06/24 2005) SpO2:  [86 %-100 %] 100 % (06/24 2005)  I/O last 3 completed shifts: In: 5647.3 [I.V.:2737.3; Blood:2910] Out: 3759 [Urine:1215; Blood:2544] Total I/O In: 320 [Blood:320] Out: -    Physical Exam:  General: alert and mild distress Lungs: clear to auscultation bilaterally Breasts: normal appearance, no masses or tenderness Heart: regular rhythm, S1, S2 normal, no murmur, click, rub or gallop. Mild tachycardia present Abdomen: soft, non-tender; bowel sounds normal; no masses,  no organomegaly Pelvis: Lochia appropriate, Uterine Fundus firm, Incision: dressing clean/dry/intact Extremities: DVT Evaluation: No evidence of DVT seen on physical exam. Negative Homan's sign. No cords or calf tenderness.  +2 pitting edema of bilateral feet up to shin. SCDs in place.     Labs:  CBC Latest Ref Rng & Units 10/18/2017 10/18/2017 10/17/2017  WBC 3.6 - 11.0 K/uL 18.6(H) 15.7(H) 9.0  Hemoglobin 12.0 - 16.0 g/dL 9.6(E5.7(L) 7.2(L) 12.1  Hematocrit 35.0 - 47.0 % 17.0(L) 21.5(L) 34.5(L)  Platelets 150 - 440 K/uL 40(L) 60(L) 124(L)    Results for [name], [name] Castro (MRN [id number]) as of 10/18/2017 20:50  Ref. Range 10/17/2017 19:19 10/18/2017 09:26 10/18/2017 17:24  Fibrinogen Latest Ref Range: 210 - 475 mg/dL 147216 829145 (L) 562285  Prothrombin Time Latest Ref Range: 11.4 - 15.2 seconds 14.3 15.5 (H) 14.9  INR Unknown 1.12 1.24 1.18  APTT Latest Ref Range: 24 - 36 seconds 29 28 27     Results for [name], [name] Castro (MRN [id number]) as of 10/18/2017 20:50  Ref. Range 10/17/2017 19:19 10/18/2017 09:26  COMPREHENSIVE METABOLIC PANEL Unknown Rpt (A) Rpt (A)  Sodium Latest Ref Range: 135 - 145 mmol/L 135 134 (L)  Potassium Latest Ref Range: 3.5 - 5.1 mmol/L 3.5 4.2  Chloride Latest Ref Range: 101 - 111 mmol/L 106 105  CO2 Latest Ref Range: 22 - 32 mmol/L 20 (L) 19 (L)  Glucose Latest Ref Range: 65 - 99 mg/dL 696124 (H) 97  BUN Latest Ref Range: 6 - 20 mg/dL 11 15  Creatinine Latest Ref Range: 0.44 - 1.00 mg/dL 2.951.02 (H) 2.841.24 (H)  Calcium Latest Ref Range: 8.9 - 10.3 mg/dL 9.0 7.9 (L)  Anion gap Latest Ref Range: 5 - 15  9 10   Alkaline Phosphatase Latest Ref Range: 38 - 126 U/L 293 (H) 298 (H)  Albumin Latest Ref Range: 3.5 - 5.0 g/dL 3.2 (L) 2.7 (L)  AST Latest Ref Range: 15 - 41 U/L 23 37  ALT Latest Ref Range: 14 - 54 U/L 13 (L) 12 (L)  Total Protein Latest Ref Range: 6.5 - 8.1 g/dL 6.5 5.3 (L)  Total Bilirubin Latest Ref Range: 0.3 - 1.2 mg/dL 0.7 0.8  GFR, Est Non African American Latest Ref Range: >60 mL/min >60 59 (L)  GFR, Est African American Latest Ref Range: >60 mL/min >60 >60    Assessment/Plan: - Status post Cesarean section. Postoperative course complicated by DIC, pre-eclampsia  - Patient is now s/p transfusion of total of 4 units PRBCs, 1 unit FFP, 2 units cryoprecipitate, and 2 units of platelets.  Due for next  blood draw now.  Will check labs q 4-6 hrs as indicated with treatments.  Is due for 1 additional unit of PRBCs.  - Magnesium sulfate to continue 24 hrs postpartum for treatment of pre-eclampsia  - Chest pain/epigastric pain unclear, normal chest and lung exam.  O2 sats wnl. Mild tachycardia, but may be secondary to discomfort or due to patient's low H/H. Given dose of fentanyl for pain,and will also administer Pepcid.  If pain does not resolve, can consider CXR vs placing back on telemetry.   - Strict I/O.  - SCDs in place for DVT prophylaxis.    Hildred Laser, MD [facility]

## 2017-10-18 NOTE — Consult Note (Signed)
Dr. Domenica ReamerP. Carroll, MDA notified of pt status, lab results and plan of care.

## 2017-10-18 NOTE — Progress Notes (Signed)
Contacted by nurse for stat CBC, Hgb 5.5, Hct 7.7, with platelets 40K.  Will continue supportive management for DIC.  Remaining DIC labs have been ordered, need to be drawn stat.  Will transfuse an additional 2 units PRBCs and  2 units platelets.  Will then get post-transfusion labs.  If further transfusions needed of blood products, will order as necessary.  Continue magnesium sulfate for pre-eclampsia.  Strict I/O. Continue supportive management. Massive transfusion protocol in effect.    Hildred Laserherry, Morty Ortwein, MD [facility]

## 2017-10-18 NOTE — Progress Notes (Signed)
LABOR NOTE   [name] Castro 27 y.o.GP@ at 5052w0d Early latent labor.  SUBJECTIVE:  Comfortable with epidural  OBJECTIVE:  BP 120/67   Pulse 90   Temp 98.9 F (37.2 C) (Oral)   Resp 14   Ht 5\' 5"  (1.651 m)   Wt 217 lb (98.4 kg)   LMP 02/08/2017 (Exact Date)   SpO2 99%   BMI 36.11 kg/m  Total I/O In: 2315.6 [I.V.:2315.6] Out: 439 [Urine:320; Blood:119]  She has not shown cervical change. CERVIX: 4cm:  60%:   -3:   mid position:   firm SVE:   Dilation: 3.5 Effacement (%): 70 Station: -2 Exam by:: Janee Mornhompson, CNM CONTRACTIONS: irritability FHR: absent, IUFD   Analgesia: Epidural  Labs: Lab Results  Component Value Date   WBC 9.0 10/17/2017   HGB 12.1 10/17/2017   HCT 34.5 (L) 10/17/2017   MCV 90.2 10/17/2017   PLT 124 (L) 10/17/2017    ASSESSMENT: 1) Labor curve reviewed.       Progress: Early latent labor.     Membranes: intact      Active Problems:   Labor and delivery, indication for care Pre eclampsia IUFD  PLAN: continue present management with pitocin collaboration of pt care with Dr. Eula Listenherry  Pius Byrom, CNM  10/18/2017 6:02 AM

## 2017-10-18 NOTE — Anesthesia Post-op Follow-up Note (Signed)
Anesthesia QCDR form completed.        

## 2017-10-18 NOTE — Telephone Encounter (Signed)
[facility] Curbside Note  I was called by one of my [facility] partners and asked to review Ms. Pavel's chart.  Of note, I was NOT officially consulted by the primary obstetrician and my comments are based on review of the electronic medical records and brief discussion with the primary obstetrician (Dr. Brennan Baileyavid Evans).  Ms. Aurther Lofterry is a 27 yo G1 P0 @ 36 0/7 weeks admitted on 6/23 and diagnosed with IUFD and e/o abruption on ultrasound.  An IOL was attempted with cytotec, however the patient only progressed to 3 cm with no labor progression over the past 12 hours.  Given the currently with developing coagulopathy the patient was taken for CD.  Most recent vitals: Temp 36.4 HR 94 RR 19 BP 110/89 (bp max was 156/131) 99% 217 lbs   Lab review:  10/17/17 @ 19:19  Hct 34.5 Hgb 12.1 Plt 124 Glucose 124 Cr 1.02 AST 23  ALT 13  Fibrinogen 216 INR 1.12 PT 14.3 PTT 29   KB negative PCR 1260mg   10/18/17 @ 9:30 am Hct 21.5 Hgb 7.2 Plt 60 Glucose 97 Cr 1.24 AST 37  ALT 12  Fibrinogen 145 INR 1.2 PT 15.5 PTT 28   Discussion Overview: Per my discussion with Dr. Logan BoresEvans, Ms. Aurther Lofterry was given 2 units PRBCs and 1 unit of cryo.  He is awaiting repeat labs following the administration of these products.  Given the worsening labs and remote from delivery, Dr. Logan BoresEvans plans for CD and was awaiting platelet transfusion and additional blood products prior to starting the case when we spoke.  OB anesthesia was aware and actively involved in the patient's management.  The massive transfusion protocol was activated and the blood bank was aware of the patient.  The patient has adequate access with 2 IVs and a working epidural.   Given concern for possible PEC as an etiology for the abruption/IUFD (based on prior elevated bps, elevated Cr and PCR) Ms. Aurther Lofterry was started on Magnesium.   Considerations: I agree with the current management plan including following the massive  transfusion protocol and blood products per the protocol.  I also agree with plan for delivery via CD once products given and additional products secured.  I discussed the availability of blood products with Dr. Logan BoresEvans and he is confident [facility].  I encourage close monitoring of labs and product administration per the massive transfusion protocol.    I also agree with Mag for seizure prophylaxis and recommend close monitoring of HELLP labs.  Given Cr above 1.2 and renal clearance of Mag, I suggest close monitoring for s/sx of magnesium toxicity and consider monitoring mag levels.  We are happy to provide formal consultation and/or accept the patient for transfer post-partum if desired by the primary team.  We are also happy to provide future postpartum/preconception consultation in the future.    Janeann ForehandS. Velinda Wrobel, MD

## 2017-10-18 NOTE — Progress Notes (Signed)
Chaplain was paged to be with family as they spent time with the baby.  Nurses had just taken photographs and family was at the bedside. Some family members were also in the waiting room, while others waited just beyond the door. When chaplain entered the room, family members were gathered in grief-stricken embraces around the room as some took turns holding the baby. Chaplain took time to express condolences and comfort the patient's partner, Marya Amsler. Chaplain provided spiritual and emotional support as she talked with Marya Amsler about the strength and beauty of their families and implored them to lean on one another in the wake of this tragedy. Chaplain encouraged patient and her partner to be there for one another, not forsaking the opportunity to feel their deep grief. Chaplain also encouraged Marya Amsler to consider opportunities for counseling because they/she may benefit greatly from holistic care as they move forward. More family members entered the room and comforted one another. Chaplain asked for permission to pray for them and their strength. Nurses joined [place] in prayer. After conclusion, chaplain went to each family whom she had not yet met to offer condolences. Chaplain will continue to monitor and return later.    10/18/17 1900  Clinical Encounter Type  Visited With Patient and family together  Visit Type Follow-up;Spiritual support  Spiritual Encounters  Spiritual Needs Prayer;Emotional;Grief support  Stress Factors  Patient Stress Factors  (Infant death at [redacted] wks gestation )

## 2017-10-18 NOTE — Progress Notes (Signed)
Chaplain called by the Operator @ 14:35 to provide emotional for [name] Castro the father of the deceased infant named [name] Castro. Upon arrival patient was in care with close supervision. [name] nurse provided details to the Chaplain pertinent for the provision of care, including family members and their relationships to the patient. Chaplain attended to [place] and provided emotional support and prayer, allowing the patient to grieve. The aunt rushed to provide a theological meaning, which was not helpful and impeded the grieving process. Chaplain prayed with [name] Castro and the patient. Later, Chaplain went with [name] Castro to view [place]. Chaplain returned to the suite and provided pastoral presence, emotional and grief support, and multiple prayers. At 15:55, Chaplain referred the family's care [facility] Andrea Edwards.

## 2017-10-18 NOTE — Progress Notes (Signed)
   10/18/17 1300  Clinical Encounter Type  Visited With Patient not available;Health care provider  Visit Type Follow-up   Chaplain attempted to follow up with patient based on previous chaplain's morning report.  Per patient care team, she will be heading to surgery soon and the room is currently filled with family members.  This chaplain will update on-call chaplain as staff indicated that patient/family will likely need care later on.

## 2017-10-18 NOTE — Transfer of Care (Signed)
Immediate Anesthesia Transfer of Care Note  Patient: [name] Castro  Procedure(s) Performed: CESAREAN SECTION (N/A )  Patient Location: Mother/Baby  Anesthesia Type:Epidural  Level of Consciousness: awake and alert   Airway & Oxygen Therapy: Patient Spontanous Breathing  Post-op Assessment: Report given to RN and Post -op Vital signs reviewed and stable  Post vital signs: Reviewed and stable  Last Vitals:  Vitals Value Taken Time  BP    Temp    Pulse    Resp    SpO2      Last Pain:  Vitals:   10/18/17 1215  TempSrc: Oral  PainSc:       Patients Stated Pain Goal: 0 (10/17/17 2100)  Complications: No apparent anesthesia complications

## 2017-10-18 NOTE — Op Note (Signed)
Cesarean Section Procedure Note  Indications: abruptio placenta and pre-eclampsia without severe features, and DIC, failed induction of labor  Pre-operative Diagnosis: 36 week 0 day pregnancy, IUFD, placental abruption, pre-eclampsia without severe features, DIC, failed induction of labor.  Post-operative Diagnosis: same  Surgeon: Hildred LaserAnika Dawnielle Christiana, MD  Assistants: Brennan Baileyavid Evans, MD; Doreene BurkeAnnie Thompson, CNM  Procedure: Primary  Anesthesia: Spinal anesthesia  Procedure Details: The patient was seen in the Holding Room. The risks, benefits, complications, treatment options, and expected outcomes were discussed with the patient.  The patient concurred with the proposed plan, giving informed consent.  The site of surgery properly noted/marked. She received 2 units PRBCs, 1 unit cryoprecipitate, 1 unit of platelets pre-operatively, and will receive 1 unit of FFP in the OR. The patient was taken to the Operating Room, identified as [name] Castro and the procedure verified as C-Section Delivery. A Time Out was held and the above information confirmed.  After induction of anesthesia, the patient was draped and prepped in the usual sterile manner. Anesthesia was tested and noted to be adequate. A Pfannenstiel incision was made and carried down through the subcutaneous tissue to the fascia. Fascial incision was made and extended transversely. The fascia was separated from the underlying rectus tissue superiorly and inferiorly. The peritoneum was identified and entered. Peritoneal incision was extended longitudinally. The Alexis retractor was then placed for retraction. The utero-vesical peritoneal reflection was incised transversely and the bladder flap was bluntly freed from the lower uterine segment. A low transverse uterine incision was made. Delivered from cephalic presentation was a 6 lb 6 oz Female with Apgar scores of 0 at one minute and 0 at five minutes.  The umbilical cord was clamped and cut.  Cord blood was  obtained for evaluation. The placenta was removed intact and appeared normal. The uterus was exteriorized and cleared of all clots and debris. The uterine outline, tubes and ovaries appeared normal.  The uterine incision was closed with running locked sutures of 0-Vicryl.  Two figure-of-eight suture of 0-Vicryl were used to achieve hemostasis along the midline of the incision.  Hemostasis was observed. The uterus was returned to the abdomen. The gutters were cleared of all clots and debris. The fascia was then reapproximated with a running suture 0- Vicryl. The skin was reapproximated with 4-0 Monocryl.  Instrument, sponge, and needle counts were correct prior the abdominal closure and at the conclusion of the case.   Findings: Female infant, cephalic presentation, 6 lbs 6 oz, with Apgar scores of 0 at one minute and 0 at five minutes. Intact placenta with 3 vessel cord.  The uterine outline, tubes and ovaries appeared normal.   Estimated Blood Loss:  2230 ml (however at least 1900 ml was in old dark clots representing previous abruption)  Drains: foley catheter to gravity drainage, 120 ml clear urine at end of the procedure         Total IV Fluids:  2440 ml (including 1 unit FFP, 1 unit cryoprecipitate, 2 units PRBCs, 1 unit of platelets)  Specimens: Placenta and Disposition:  Sent to Pathology         Implants: None         Complications:  None; patient tolerated the procedure well.         Disposition: PACU - hemodynamically stable.         Condition: stable   Hildred Laserherry, Jayel Scaduto, MD [facility]

## 2017-10-18 NOTE — Progress Notes (Addendum)
LABOR NOTE   [name] Castro 27 y.o.GP@ at 3080w0d Early latent labor.  SUBJECTIVE:  Comfortable with epidural, intermittent light headedness.  OBJECTIVE:  BP 113/73   Pulse 92   Temp 98.9 F (37.2 C) (Oral)   Resp 14   Ht 5\' 5"  (1.651 m)   Wt 217 lb (98.4 kg)   LMP 02/08/2017 (Exact Date)   SpO2 99%   BMI 36.11 kg/m  Total I/O In: 121.8 [I.V.:121.8] Out: 245 [Urine:50; Blood:195]  She has not shown cervical change. CERVIX: 3-4cm:  60%:   -3:   mid position:   firm SVE:   Dilation: 3.5 Effacement (%): 70 Station: -2 Exam by:: Janee Mornhompson, CNM CONTRACTIONS: irritability IUPC placed WJX:[id number]:absent, IUFD Analgesia: Epidural  Labs: Lab Results  Component Value Date   WBC 9.0 10/17/2017   HGB 12.1 10/17/2017   HCT 34.5 (L) 10/17/2017   MCV 90.2 10/17/2017   PLT 124 (L) 10/17/2017    ASSESSMENT: 1) Labor curve reviewed.       Progress: Early latent labor.     Membranes: ruptured, bloody         Active Problems:   Labor and delivery, indication for care Pre eclampsia  IUFD  PLAN: IV Pitocin augmentation, continue magnesium sulfate therapy, Repeat labs ordered,  Dr. Valentino Saxonherry collaborating on plan of care  Doreene Burkennie Renella Steig, [facility]  10/18/2017 9:39 AM

## 2017-10-19 ENCOUNTER — Encounter: Payer: Self-pay | Admitting: Obstetrics and Gynecology

## 2017-10-19 LAB — CBC
HCT: 23.3 % — ABNORMAL LOW (ref 35.0–47.0)
Hemoglobin: 8.1 g/dL — ABNORMAL LOW (ref 12.0–16.0)
MCH: 29.6 pg (ref 26.0–34.0)
MCHC: 34.8 g/dL (ref 32.0–36.0)
MCV: 85.1 fL (ref 80.0–100.0)
PLATELETS: 87 10*3/uL — AB (ref 150–440)
RBC: 2.74 MIL/uL — ABNORMAL LOW (ref 3.80–5.20)
RDW: 17.6 % — AB (ref 11.5–14.5)
WBC: 13.1 10*3/uL — AB (ref 3.6–11.0)

## 2017-10-19 LAB — RPR: RPR Ser Ql: NONREACTIVE

## 2017-10-19 MED ORDER — SODIUM CHLORIDE 0.9% FLUSH
10.0000 mL | INTRAVENOUS | Status: DC | PRN
  Administered 2017-10-19: 10 mL via INTRAVENOUS
  Filled 2017-10-19: qty 10

## 2017-10-19 MED ORDER — CALCIUM CARBONATE ANTACID 500 MG PO CHEW
400.0000 mg | CHEWABLE_TABLET | Freq: Two times a day (BID) | ORAL | Status: DC
  Administered 2017-10-19 – 2017-10-21 (×5): 400 mg via ORAL
  Filled 2017-10-19 (×6): qty 2

## 2017-10-19 MED ORDER — OXYCODONE HCL 5 MG PO TABS: 5.0000 mg | ORAL_TABLET | Freq: Once | ORAL | Status: DC | PRN

## 2017-10-19 MED ORDER — OXYCODONE HCL 5 MG/5ML PO SOLN
5.0000 mg | Freq: Once | ORAL | Status: DC | PRN
  Filled 2017-10-19: qty 5

## 2017-10-19 MED ORDER — SODIUM CHLORIDE FLUSH 0.9 % IV SOLN
INTRAVENOUS | Status: AC
  Administered 2017-10-19: 10 mL via INTRAVENOUS
  Filled 2017-10-19: qty 10

## 2017-10-19 NOTE — Anesthesia Postprocedure Evaluation (Signed)
Anesthesia Post Note  Patient: [name] Castro  Procedure(s) Performed: CESAREAN SECTION (N/A )  Patient location during evaluation: L&D Anesthesia Type: Epidural Level of consciousness: awake and alert Pain management: pain level controlled Vital Signs Assessment: post-procedure vital signs reviewed and stable Respiratory status: spontaneous breathing, nonlabored ventilation and respiratory function stable Cardiovascular status: stable Postop Assessment: no headache, no backache and epidural receding Anesthetic complications: no     Last Vitals:  Vitals:   10/19/17 0721 10/19/17 0821  BP:    Pulse:    Resp: 16 19  Temp:    SpO2:      Last Pain:  Vitals:   10/19/17 0521  TempSrc: Oral  PainSc:                  Rica MastBachich,  Karlee Staff M

## 2017-10-19 NOTE — Care Management (Signed)
Platelets now over 100,000. Will d/c the epidural.

## 2017-10-19 NOTE — Progress Notes (Signed)
Nursing staff encouraged chaplain visit to patient and family who were awake this morning while [facility]plain was in the [facility]. Nursing staff thought it would be a good idea to say goodbye to them because they would likely appreciate such a gesture, given our time together. Chaplain entered the dark room quietly as mother, father and paternal grandmother were resting in the room. Rosalyn GessGrayson was swaddled at mom's bedside in his bassinet. Tammy SoursGreg was laying on an air mattress on the floor and was crying. Chaplain provided emotional and grief support and hugged and comforted Tammy SoursGreg, then chaplain comforted patient. Chaplain reminded them to take good care of one another and themselves and said she would continue to lift them in light, love and prayer.    10/19/17 0538  Clinical Encounter Type  Visited With Patient and family together  Visit Type Follow-up  Referral From Nurse  Spiritual Encounters  Spiritual Needs Emotional;Grief support

## 2017-10-19 NOTE — Progress Notes (Signed)
Postpartum Day # 1: Cesarean Delivery of 36 week IUFD (secondary to abruption), DIC, severe pre-eclampsia  Subjective: Patient denies complaints this morning. Comfortable.   Objective: Vital signs in last 24 hours: Temp:  [97.5 F (36.4 C)-100.3 F (37.9 C)] 98.4 F (36.9 C) (06/25 0521) Pulse Rate:  [79-118] 90 (06/25 0618) Resp:  [13-37] 18 (06/25 0618) BP: (81-139)/(46-96) 126/64 (06/25 0618) SpO2:  [86 %-100 %] 94 % (06/25 0615)  I/O last 3 completed shifts: In: 5647.3 [I.V.:2737.3; Blood:2910] Out: 3873 [Urine:1215; Blood:2658] Total I/O In: 1323.3 [Blood:1323.3] Out: 1379 [Urine:1200; Blood:179]   Physical Exam:  General: alert and no distress Lungs: clear to auscultation bilaterally  Breasts: normal appearance, no masses or tenderness Heart: regular rhythm, S1, S2 normal, no murmur, click, rub or gallop. Mild tachycardia present Abdomen: soft, mildly tender to palpation (however appropriate) near incision site; bowel sounds normal; no masses,  no organomegaly Pelvis: Lochia appropriate, Uterine Fundus firm, Incision: dressing clean/dry/intact Extremities: DVT Evaluation: No evidence of DVT seen on physical exam. Negative Homan's sign. No cords or calf tenderness.  +1 pitting edema of bilateral feet up to shin. SCDs in place.  Neurologic: negative for clonus, reflexes noted     Labs:  CBC Latest Ref Rng & Units 10/19/2017 10/18/2017 10/18/2017  WBC 3.6 - 11.0 K/uL 13.1(H) 14.1(H) 18.6(H)  Hemoglobin 12.0 - 16.0 g/dL 8.1(L) 7.9(L) 5.7(L)  Hematocrit 35.0 - 47.0 % 23.3(L) 21.9(L) 17.0(L)  Platelets 150 - 440 K/uL 87(L) 106(L) 40(L)    Results for [name] Castro, [name] Castro (MRN [id number]) as of 10/18/2017 20:50  Ref. Range 10/17/2017 19:19 10/18/2017 09:26 10/18/2017 17:24  Fibrinogen Latest Ref Range: 210 - 475 mg/dL 409 811 (L) 914  Prothrombin Time Latest Ref Range: 11.4 - 15.2 seconds 14.3 15.5 (H) 14.9  INR Unknown 1.12 1.24 1.18  APTT Latest Ref Range: 24 - 36 seconds 29 28 27      Results for [name] Castro, [name] Castro (MRN [id number]) as of 10/18/2017 20:50  Ref. Range 10/17/2017 19:19 10/18/2017 09:26  COMPREHENSIVE METABOLIC PANEL Unknown Rpt (A) Rpt (A)  Sodium Latest Ref Range: 135 - 145 mmol/L 135 134 (L)  Potassium Latest Ref Range: 3.5 - 5.1 mmol/L 3.5 4.2  Chloride Latest Ref Range: 101 - 111 mmol/L 106 105  CO2 Latest Ref Range: 22 - 32 mmol/L 20 (L) 19 (L)  Glucose Latest Ref Range: 65 - 99 mg/dL 086 (H) 97  BUN Latest Ref Range: 6 - 20 mg/dL 11 15  Creatinine Latest Ref Range: 0.44 - 1.00 mg/dL 5.78 (H) 4.69 (H)  Calcium Latest Ref Range: 8.9 - 10.3 mg/dL 9.0 7.9 (L)  Anion gap Latest Ref Range: 5 - 15  9 10   Alkaline Phosphatase Latest Ref Range: 38 - 126 U/L 293 (H) 298 (H)  Albumin Latest Ref Range: 3.5 - 5.0 g/dL 3.2 (L) 2.7 (L)  AST Latest Ref Range: 15 - 41 U/L 23 37  ALT Latest Ref Range: 14 - 54 U/L 13 (L) 12 (L)  Total Protein Latest Ref Range: 6.5 - 8.1 g/dL 6.5 5.3 (L)  Total Bilirubin Latest Ref Range: 0.3 - 1.2 mg/dL 0.7 0.8  GFR, Est Non African American Latest Ref Range: >60 mL/min >60 59 (L)  GFR, Est African American Latest Ref Range: >60 mL/min >60 >60    Assessment/Plan: - Status post Cesarean section. Postoperative course complicated by DIC, pre-eclampsia  - Patient is now s/p transfusion of total of 5 units PRBCs, 1 unit FFP, 2 units cryoprecipitate, and 2 units of platelets.  Platelets did decline some. Will continue to monitor.  Remainder of labs stable.  - Magnesium sulfate to continue 24 hrs postpartum for treatment of pre-eclampsia. Last mag level was 6.7. Continue neuro checks.  - Mild rales of left lower lobe noted anteriorly. Given incentive spirometer.  Also limiting excess fluids to decrease risk of development of pulmonary edema.   - Continue Strict I/O.  - SCDs in place for DVT prophylaxis.  - Pepcid for DVT prophylaxis - Prescribed Tums for calcium replacement.  - Encouraged incentive spirometry.  -Continue current  care  Hildred LaserAnika Younique Casad, MD [facility]

## 2017-10-20 LAB — TYPE AND SCREEN
ABO/RH(D): O POS
Antibody Screen: NEGATIVE
UNIT DIVISION: 0
UNIT DIVISION: 0
UNIT DIVISION: 0
Unit division: 0
Unit division: 0
Unit division: 0
Unit division: 0

## 2017-10-20 LAB — BPAM CRYOPRECIPITATE
BLOOD PRODUCT EXPIRATION DATE: 201906241908
Blood Product Expiration Date: 201906241749
Blood Product Expiration Date: 201906241908
ISSUE DATE / TIME: 201906241214
ISSUE DATE / TIME: 201906241333
ISSUE DATE / TIME: 201906241333
UNIT TYPE AND RH: 5100
Unit Type and Rh: 5100
Unit Type and Rh: 5100

## 2017-10-20 LAB — PREPARE CRYOPRECIPITATE
UNIT DIVISION: 0
UNIT DIVISION: 0
Unit division: 0

## 2017-10-20 LAB — CBC WITH DIFFERENTIAL/PLATELET
Basophils Absolute: 0 10*3/uL (ref 0–0.1)
Basophils Relative: 0 %
Eosinophils Absolute: 0.1 10*3/uL (ref 0–0.7)
Eosinophils Relative: 1 %
HCT: 21.7 % — ABNORMAL LOW (ref 35.0–47.0)
HEMOGLOBIN: 7.6 g/dL — AB (ref 12.0–16.0)
LYMPHS ABS: 1.3 10*3/uL (ref 1.0–3.6)
LYMPHS PCT: 11 %
MCH: 30.2 pg (ref 26.0–34.0)
MCHC: 34.9 g/dL (ref 32.0–36.0)
MCV: 86.7 fL (ref 80.0–100.0)
MONOS PCT: 7 %
Monocytes Absolute: 0.8 10*3/uL (ref 0.2–0.9)
NEUTROS PCT: 81 %
Neutro Abs: 9.7 10*3/uL — ABNORMAL HIGH (ref 1.4–6.5)
PLATELETS: 79 10*3/uL — AB (ref 150–440)
RBC: 2.5 MIL/uL — ABNORMAL LOW (ref 3.80–5.20)
RDW: 18.4 % — AB (ref 11.5–14.5)
WBC: 12 10*3/uL — AB (ref 3.6–11.0)

## 2017-10-20 LAB — BPAM PLATELET PHERESIS
BLOOD PRODUCT EXPIRATION DATE: 201906242359
BLOOD PRODUCT EXPIRATION DATE: 201906242359
ISSUE DATE / TIME: 201906241629
ISSUE DATE / TIME: 201906241313
Unit Type and Rh: 5100
Unit Type and Rh: 5100

## 2017-10-20 LAB — PREPARE PLATELET PHERESIS
UNIT DIVISION: 0
Unit division: 0

## 2017-10-20 LAB — BPAM FFP
BLOOD PRODUCT EXPIRATION DATE: 201906292359
BLOOD PRODUCT EXPIRATION DATE: 201906292359
Blood Product Expiration Date: 201906292359
ISSUE DATE / TIME: 201906241332
UNIT TYPE AND RH: 5100
Unit Type and Rh: 5100
Unit Type and Rh: 5100

## 2017-10-20 LAB — BPAM RBC
BLOOD PRODUCT EXPIRATION DATE: 201907162359
BLOOD PRODUCT EXPIRATION DATE: 201907182359
BLOOD PRODUCT EXPIRATION DATE: 201907192359
BLOOD PRODUCT EXPIRATION DATE: 201907192359
Blood Product Expiration Date: 201906272359
Blood Product Expiration Date: 201907162359
Blood Product Expiration Date: 201907162359
ISSUE DATE / TIME: 201906241120
ISSUE DATE / TIME: 201906241313
ISSUE DATE / TIME: 201906242221
ISSUE DATE / TIME: 201906241708
ISSUE DATE / TIME: 201906241906
UNIT TYPE AND RH: 5100
UNIT TYPE AND RH: 5100
Unit Type and Rh: 5100
Unit Type and Rh: 5100
Unit Type and Rh: 5100
Unit Type and Rh: 5100
Unit Type and Rh: 9500

## 2017-10-20 LAB — PREPARE FRESH FROZEN PLASMA
Unit division: 0
Unit division: 0
Unit division: 0

## 2017-10-20 LAB — SURGICAL PATHOLOGY

## 2017-10-20 LAB — PREPARE RBC (CROSSMATCH)

## 2017-10-20 NOTE — Progress Notes (Signed)
Patient ID: [name] Castro, female   DOB: 09/09/1990, 27 y.o.   MRN: [id number]    Progress Note - Cesarean Delivery  [name] Castro is a 27 y.o. G1P0100 now PP day 2 s/p C-Section, Low Transverse .   Subjective:  Patient reports no problems with eating, bowel movements, voiding, or their wound.  Not lightheaded OOB.   Objective:  Vital signs in last 24 hours: Temp:  [98 F (36.7 C)-98.5 F (36.9 C)] 98.5 F (36.9 C) (06/26 1206) Pulse Rate:  [87-95] 89 (06/26 1206) Resp:  [14-20] 14 (06/26 1206) BP: (114-136)/(71-85) 132/85 (06/26 1206) SpO2:  [98 %-100 %] 99 % (06/25 2350)  Physical Exam:  General: alert, cooperative and no distress Lochia: appropriate Uterine Fundus: firm Incision: healing well DVT Evaluation: No evidence of DVT seen on physical exam.    Data Review Recent Labs    10/19/17 0500 10/20/17 0722  HGB 8.1* 7.6*  HCT 23.3* 21.7*    Assessment:  Active Problems:   Labor and delivery, indication for care   Status post Cesarean section. Doing well postoperatively.   Anemic but stable and not symptomatic  Minimal bleeding     Plan:       Continue current care.  Transfer to floor  Routine post-op CD care  Consider discharge tomorrow if pt remains stable and continues to improve. Discussed care in detail with patient    Elonda Huskyavid J. Reda Citron, M.D. 10/20/2017 1:02 PM

## 2017-10-20 NOTE — Plan of Care (Signed)
Pt ambulating to restroom without difficulty with staff and s/o assistance. No c/o headache, dizziness, chest pain or nausea. Pt reporting abdominal and incisional discomfort, cold pack to incisional site, splinting, abdominal binder on and pt reports it helps relieve some discomfort and feels good. Bilateral SCD on, pt using incentive spirometer while awake. Roxicodone given for pain x2 this shift. Dressing change performed, old dressing removed, new dressing applied. pt tolerated well.

## 2017-10-21 NOTE — Discharge Instructions (Signed)
Cesarean Delivery, Care After °Refer to this sheet in the next few weeks. These instructions provide you with information on caring for yourself after your procedure. Your health care provider may also give you specific instructions. Your treatment has been planned according to current medical practices, but problems sometimes occur. Call your health care provider if you have any problems or questions after you go home. °What can I expect after the procedure? °After your procedure, it is typical to have the following: °· You may feel very fatigued. °· You may have pain at the incision site for several days. The incision will probably continue to be tender to the touch for several weeks. Your incision will take about 4-6 weeks to heal. °· You may have vaginal bleeding and discharge that will start out red, then become pink, then yellow, then white. Even though you did not have a vaginal delivery, you will still have vaginal bleeding and discharge. Altogether, this usually lasts for about 6 weeks. °· The combination of having lost your baby and changing hormones from the delivery can make you feel very sad. You may also experience emotions that change very quickly. Some of the emotions people notice after loss include: °? Anger. °? Denial. °? Guilt. °? Sorrow. °? Depression. °? Grief. °? Relationship problems. ° °Follow these instructions at home: °· Consider seeking support for your loss. Some forms of support that you might consider include your religious leader, friends, family, a professional counselor, or a bereavement support group. °· Take medicines only as directed by your health care provider. °· Continue to use good perineal care. Good perineal care includes: °? Wiping your perineum from front to back. °? Keeping your perineum clean. °· Check your incision daily for increased redness, drainage, swelling, or separation of skin. °· Clean your incision gently with soap and water every day, and then pat it dry.  If your health care provider says it is okay, leave the incision uncovered. Use a bandage (dressing) if the incision is draining fluid or appears irritated. If the adhesive strips across the incision do not fall off within 7 days, carefully peel them off or as directed by your health care provider. °· Hug a pillow when coughing or sneezing until your incision is healed. This helps to relieve pain. °· Shower, wash your hair, and take tub baths as directed by your health care provider. °· Wear a well-fitting bra that provides breast support. °· Limit wearing support panties or control-top hose. °· Drink enough fluids to keep your urine clear or pale yellow. °· Eat high-fiber foods such as whole grain cereals and breads, brown rice, beans, and fresh fruits and vegetables every day. These foods may help prevent or relieve constipation. °· Follow your health care provider's directions about resuming activities such as climbing stairs, driving, lifting, exercising, or traveling. °· Increase your activities gradually. °· Talk to your health care provider about resuming sexual activities. This depends on your risk of infection, your rate of healing, and your comfort and desire to resume sexual activity. °· Try to have someone help you with your household activities for at least a few days after you leave the hospital. °· Rest as much as possible. °· Keep all of your scheduled postpartum appointments. It is very important to keep your scheduled follow-up appointments. At these appointments, your health care provider will be checking to make sure that you are healing physically and emotionally. °· Do not use tampons or douche until your health care provider   says it is okay. °· Do not drink alcohol, especially if you are taking medicine to relieve pain. °· Do not use any tobacco products including cigarettes, chewing tobacco, or electronic cigarettes. If you need help quitting, ask your health care provider. °· Do not use  illegal drugs. °Contact a health care provider if: °· You feel sad or depressed. °· You have thoughts of hurting yourself. °· You are having trouble eating or sleeping. °· You cannot enjoy the things in life you have previously enjoyed. °· You are passing large clots from your vagina. Save any clots to show your health care provider. °· You have heavy red vaginal flow (like your usual menstrual period) for more than four days after your cesarean delivery. °· You have a foul smelling discharge from your vagina. °· You have pain or trouble urinating or are urinating frequently. °· You have a change in your bowel movements. °· You have increasing redness, pain, or swelling near your incision. °· You have pus draining from your incision. °· Your incision is separating. °· You have painful, hard, or reddened breasts. °· You have a severe headache. °· You have blurred vision or see spots. °· You are dizzy or light-headed. °· You have a rash. °· You have pain, redness, or swelling at the site of the removed IV tube. °· You have nausea or vomiting. °· You have not had a menstrual period within 12 weeks of delivery. °· You have a fever. °Get help right away if: °· You are concerned that you may hurt yourself or you are considering suicide. °· You have persistent pain. °· You have chest pain. °· You have shortness of breath. °· You faint. °· You have leg pain or swelling. °· You have stomach pain. °· Your vaginal bleeding saturates 2 or more sanitary pads in 1 hour. °This information is not intended to replace advice given to you by your health care provider. Make sure you discuss any questions you have with your health care provider. °Document Released: 08/28/2013 Document Revised: 08/22/2015 Document Reviewed: 05/25/2013 °[employer] Interactive Patient Education © 2018 [employer] ° °

## 2017-10-21 NOTE — Discharge Summary (Signed)
    Physician Obstetric Discharge Summary  Patient ID: Leeroy Bockaula Israelson MRN: [id number] DOB/AGE: 26/05/1990 26 y.o.   Date of Admission: 10/17/2017  Date of Discharge: 10/21/2017  Admitting Diagnosis: Placental abruption  IUFD at [redacted]w[redacted]d  Mode of Delivery: primary cesarean section       low uterine, transverse     Discharge Diagnosis: Placental abruption, PIH, IUFD, acute blood loss, DIC   Intrapartum Procedures: epidural, pitocin augmentation and transfusion of multiple blood products   Post partum procedures: blood transfusion                         Discharge Day SOAP Note:  Subjective:  The patient has no complaints.  She is ambulating well. She is taking PO well. Pain is well controlled with current medications. Patient is urinating without difficulty.   She is passing flatus.    Objective  Vital signs in last 24 hours: BP 118/89 (BP Location: Left Arm)   Pulse 83   Temp 98.3 F (36.8 C) (Oral)   Resp 14   Ht 5\' 5"  (1.651 m)   Wt 217 lb (98.4 kg)   LMP 02/08/2017 (Exact Date)   SpO2 99%   BMI 36.11 kg/m   Physical Exam: Gen: NAD Abdomen:  clean, dry, no drainage, healing Fundus Fundal Tone: Firm  Lochia Amount: Scant     Data Review Labs: CBC Latest Ref Rng & Units 10/20/2017 10/19/2017 10/18/2017  WBC 3.6 - 11.0 K/uL 12.0(H) 13.1(H) 14.1(H)  Hemoglobin 12.0 - 16.0 g/dL 7.6(L) 8.1(L) 7.9(L)  Hematocrit 35.0 - 47.0 % 21.7(L) 23.3(L) 21.9(L)  Platelets 150 - 440 K/uL 79(L) 87(L) 106(L)   O POS Performed at [facility], [address] [place], [place] [address]   Assessment:  Active Problems:   Labor and delivery, indication for care   Doing well.  Normal progress as expected.    Plan:  Discharge to home  Modified rest as directed - may slowly resume normal activities with restrictions  as discussed.  Medications as written.  See below for additional.       Discharge Instructions: Per After Visit Summary. Activity: Advance as  tolerated. Pelvic rest for 6 weeks.  Also refer to After Visit Summary.  Wound care discussed. Diet: Regular Medications: Allergies as of 10/21/2017   No Known Allergies     Medication List    TAKE these medications   prenatal multivitamin Tabs tablet Take 1 tablet by mouth daily at 12 noon.      Outpatient follow up:  Follow-up Information    Hildred Laserherry, Anika, MD Follow up in 1 week(s).   Specialties:  Obstetrics and Gynecology, Radiology Contact information: [address] [place] [address] [phone]          Postpartum contraception: Will discuss at first post-partum visit.  Discharged Condition: good  Discharged to: home  Newborn Data: Disposition:morgue  Apgars: APGAR (1 MIN): 0   APGAR (5 MINS): 0   APGAR (10 MINS):     Elonda Huskyavid J. Evans, M.D. 10/21/2017 2:21 PM

## 2017-10-25 ENCOUNTER — Encounter: Payer: BLUE CROSS/BLUE SHIELD | Admitting: Certified Nurse Midwife

## 2017-10-29 ENCOUNTER — Encounter: Payer: Self-pay | Admitting: Obstetrics and Gynecology

## 2017-10-29 ENCOUNTER — Ambulatory Visit (INDEPENDENT_AMBULATORY_CARE_PROVIDER_SITE_OTHER): Payer: BLUE CROSS/BLUE SHIELD | Admitting: Obstetrics and Gynecology

## 2017-10-29 VITALS — BP 103/66 | HR 114 | Ht 67.0 in | Wt 190.6 lb

## 2017-10-29 DIAGNOSIS — Z8759 Personal history of other complications of pregnancy, childbirth and the puerperium: Secondary | ICD-10-CM

## 2017-10-29 DIAGNOSIS — Z5189 Encounter for other specified aftercare: Secondary | ICD-10-CM

## 2017-10-29 DIAGNOSIS — Z98891 History of uterine scar from previous surgery: Secondary | ICD-10-CM

## 2017-10-29 DIAGNOSIS — D65 Disseminated intravascular coagulation [defibrination syndrome]: Secondary | ICD-10-CM

## 2017-10-29 NOTE — Progress Notes (Signed)
Pt stated that her incisions are healing well no other complaints.

## 2017-10-30 NOTE — Progress Notes (Signed)
    OBSTETRICS/GYNECOLOGY POST-OPERATIVE CLINIC VISIT  Subjective:     Leeroy Bockaula Samaras is a 27 y.o. [id number] who presents to the clinic 1 weeks status post primary C-section for [redacted] week gestation IUFD, placental abruption, DIC. Eating a regular diet without difficulty. Bowel movements are normal. Pain is controlled with current analgesics. Medications being used: prescription NSAID's including ibuprofen (Motrin). Is taking iron supplements as prescribed.  The following portions of the patient's history were reviewed and updated as appropriate: allergies, current medications, past family history, past medical history, past social history, past surgical history and problem list.  Review of Systems Pertinent items noted in HPI and remainder of comprehensive ROS otherwise negative.    Objective:    BP 103/66   Pulse (!) 114   Ht 5\' 7"  (1.702 m)   Wt 190 lb 9.6 oz (86.5 kg)   LMP 02/08/2017 (Exact Date)   BMI 29.85 kg/m  General:  alert and no distress  Abdomen: soft, bowel sounds active, non-tender  Incision:   healing well, no drainage, no erythema, no hernia, no seroma, no swelling, no dehiscence, incision well approximated    Pathology:  A. PLACENTA; CESAREAN SECTION:  - THIRD TRIMESTER PLACENTA, 501 GRAMS (75TH PERCENTILE FOR PROVIDED  GESTATIONAL AGE).  - FEATURES OF UTEROPLACENTAL INSUFFICIENCY, SEE NOTE.  - THREE-VESSEL UMBILICAL CORD WITH ECCENTRIC INSERTION.  - NEGATIVE FOR CHORIOAMNIONITIS, FUNISITIS AND VASCULITIS.    Labs:  Lab Results  Component Value Date   WBC 12.0 (H) 10/20/2017   HGB 7.6 (L) 10/20/2017   HCT 21.7 (L) 10/20/2017   MCV 86.7 10/20/2017   PLT 79 (L) 10/20/2017    Assessment:    Doing well postoperatively. S/p cesarean section  S/p IUFD DIC  Plan:   1. Continue any current medications. 2. Wound care discussed. 3. Operative findings again reviewed. Pathology report discussed. 4. Activity restrictions: no bending, stooping, or  squatting, no lifting more than 10-15 pounds and pelvic rest 5. Given information on support groups for perinatal loss, as well as info to a psychologist if desired for grief counseling. Patient notes she and her partner and a good family support system. Notes that each day she is coping, has some bad days but overall is working to heal both emotionally and physically and is doing ok 6. Anticipated return to work: 6-8 weeks. 7. Follow up: 5 weeks for final postpartum check. Will need repeat labs (CBC)    Hildred Laserherry, Beckam Abdulaziz, MD [facility]

## 2017-11-26 ENCOUNTER — Ambulatory Visit (INDEPENDENT_AMBULATORY_CARE_PROVIDER_SITE_OTHER): Payer: BLUE CROSS/BLUE SHIELD | Admitting: Certified Nurse Midwife

## 2017-11-26 ENCOUNTER — Encounter: Payer: Self-pay | Admitting: Certified Nurse Midwife

## 2017-11-26 NOTE — Progress Notes (Signed)
Subjective:    [name] Castro is a 27 y.o. [id number] African American female who presents for a postpartum visit. She is 6 weeks postpartum following a primary cesarean section, low transverse incision and IUFD at 36 gestational weeks. Anesthesia: epidural. I have fully reviewed the prenatal and intrapartum course. Postpartum course has been WNL. Bleeding staining only. Bowel function is normal. Bladder function is normal. Patient is not sexually active.  Contraception method is none. Postpartum depression screening: negative. Score 0.  Last pap 05/14/17 and was negative.  The following portions of the patient's history were reviewed and updated as appropriate: allergies, current medications, past medical history, past surgical history and problem list.  Review of Systems Pertinent items are noted in HPI.   Vitals:   11/26/17 1725  BP: 120/84  Pulse: 64  Weight: 191 lb (86.6 kg)   Patient's last menstrual period was 02/08/2017 (exact date).  Objective:   General:  alert, cooperative and no distress   Breasts:  deferred, no complaints  Lungs: clear to auscultation bilaterally  Heart:  regular rate and rhythm  Abdomen: soft, nontender   Vulva: normal  Vagina: normal vagina  Cervix:  closed  Corpus: Well-involuted  Adnexa:  Non-palpable  Rectal Exam: no hemorrhoids        Assessment:   Postpartum exam 6wks s/p Primary C/s for IUFD Depression screening negative Contraception counseling discussed pill and nexplanon  Plan:  : none discussed use of Pill or nexplanon. She is undecided. Pt to call for perscription or schedule an appointment for nexplanon placement PRN.  Follow up in: 6 months for annual or earlier if needed  Doreene BurkeAnnie Cherry [place], CNM

## 2017-11-26 NOTE — Progress Notes (Signed)
Pt is here for a post partum visit. Is spotting since 11/21/17. Is interested in Nexplanon. Has not resumed intercourse. Screening 0

## 2017-11-26 NOTE — Patient Instructions (Signed)

## 2019-12-07 IMAGING — US US OB LIMITED
1 series · 14 of 22 positions shown · non-contrast
Comparison: none

ADDENDUM:
Critical Value/emergent results were called by telephone at the time
of interpretation on 10/17/2017 at [DATE] to Dr. MEINO ASPERS ,
who verbally acknowledged these results.
CLINICAL DATA: Intrauterine fetal demise.

EXAM:
LIMITED OBSTETRIC ULTRASOUND

[Series 1: us ob limited · 14 of 22 slices shown]
[im 1/22]
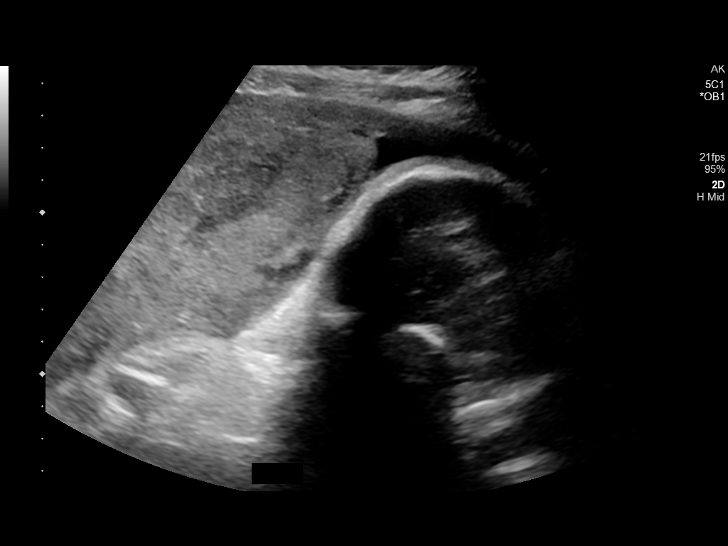
[im 3/22]
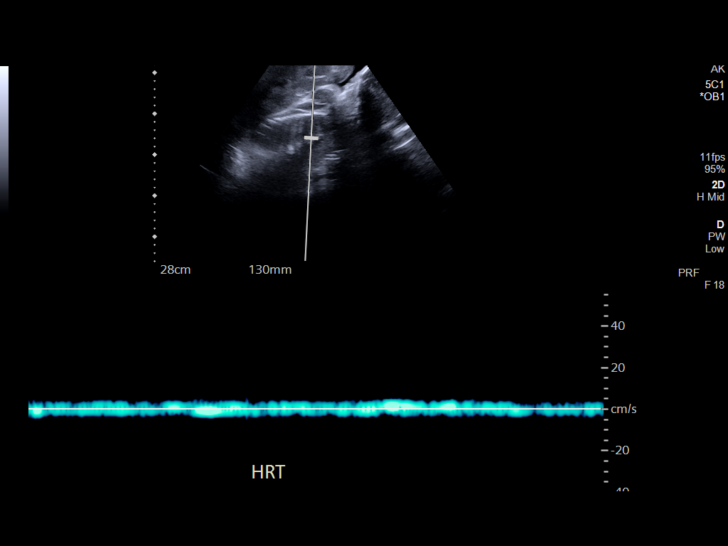
[im 4/22]
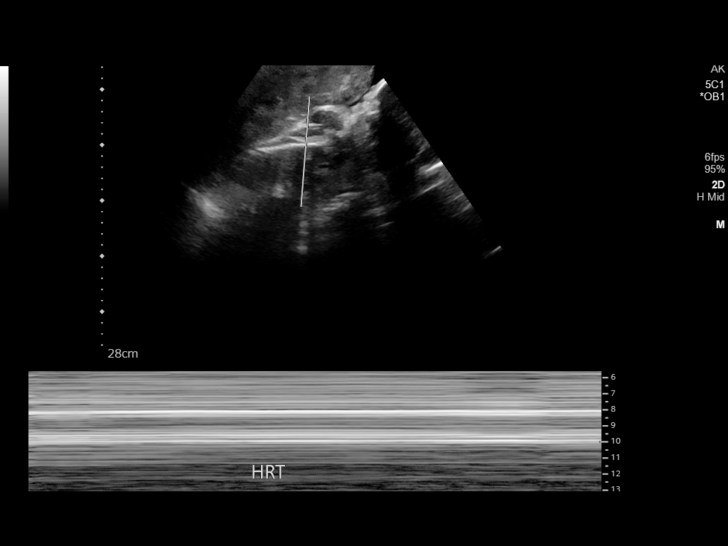
[im 6/22]
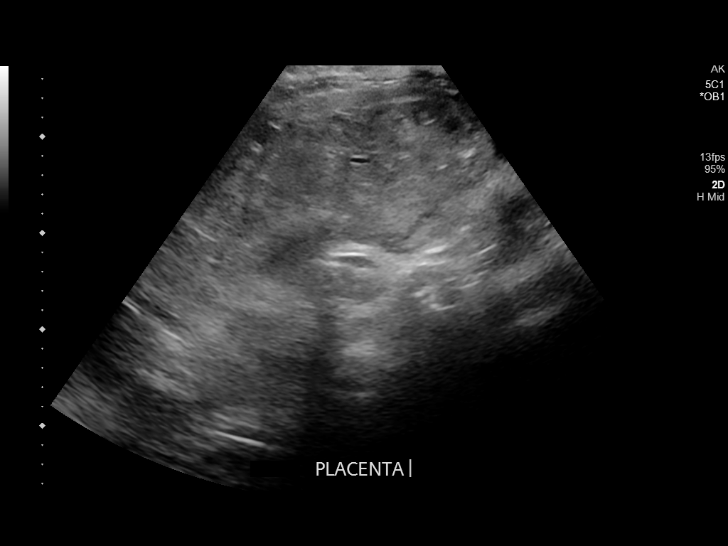
[im 8/22]
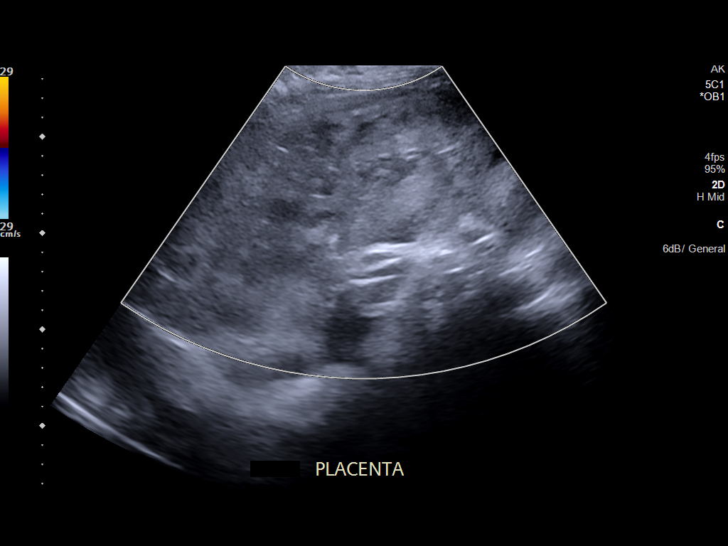
[im 9/22]
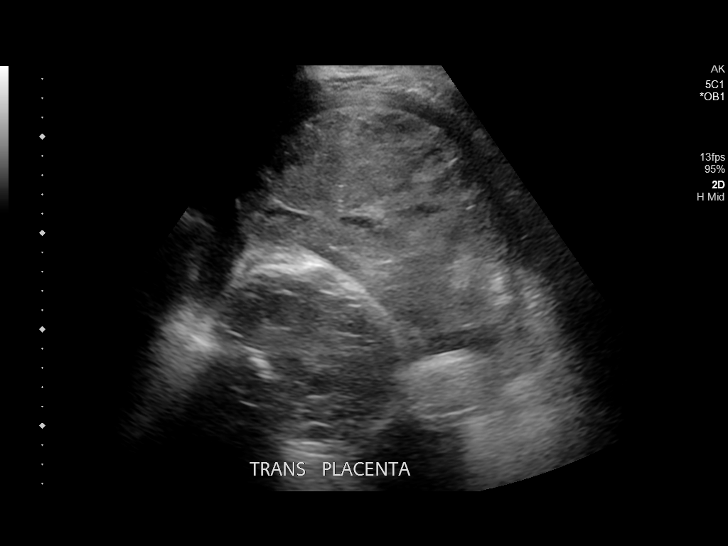
[im 11/22]
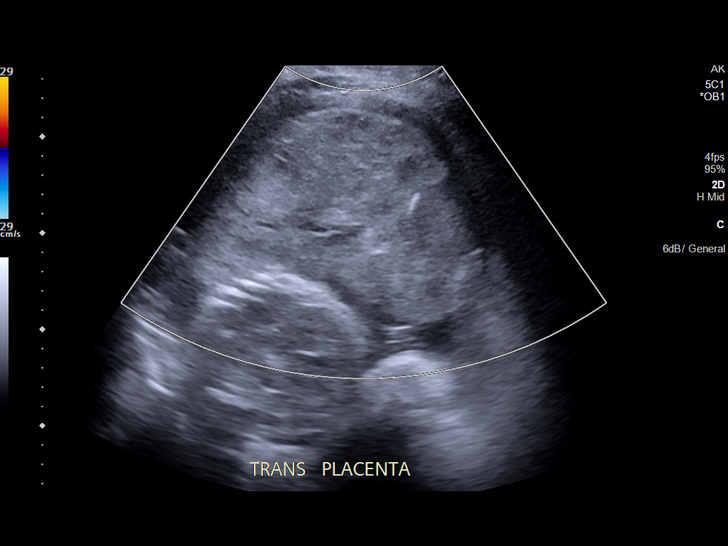
[im 12/22]
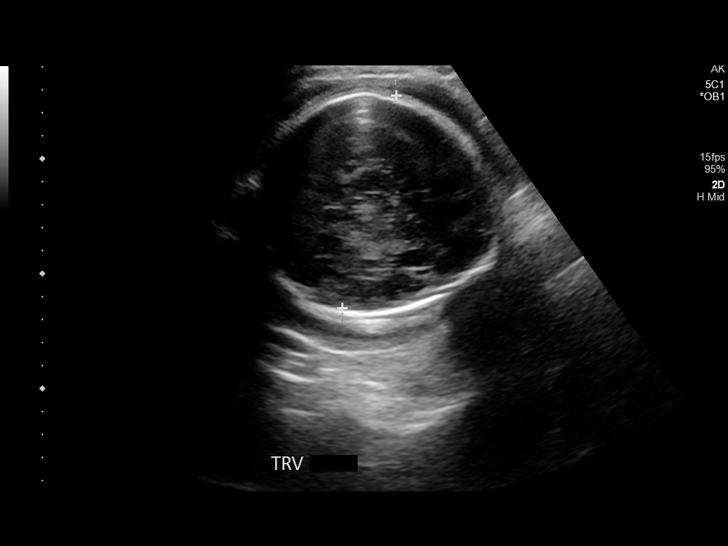
[im 14/22]
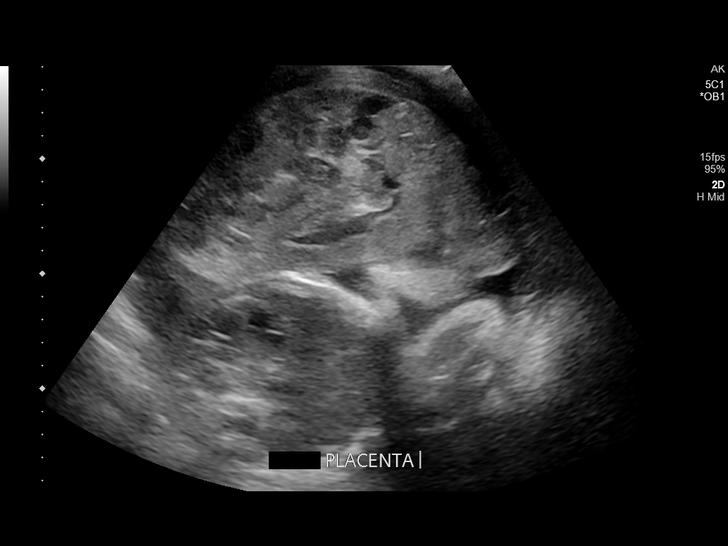
[im 15/22]
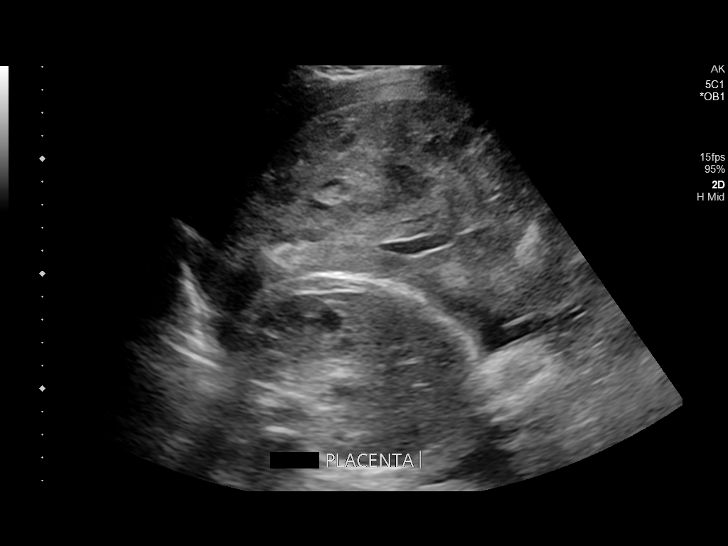
[im 17/22]
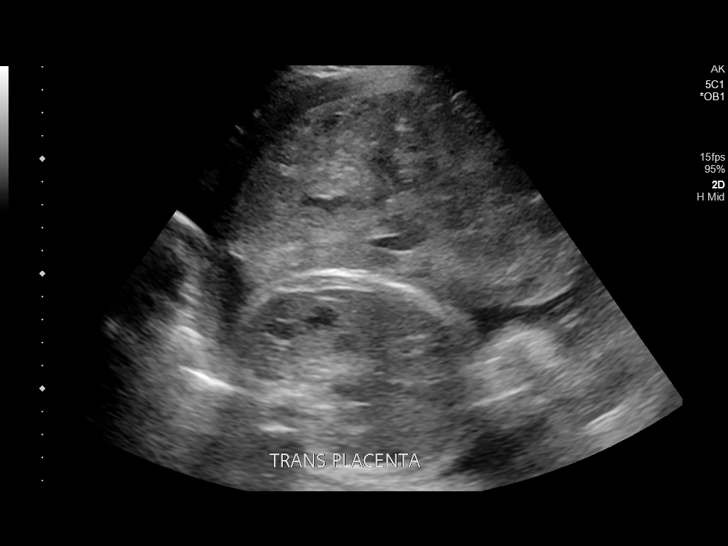
[im 19/22]
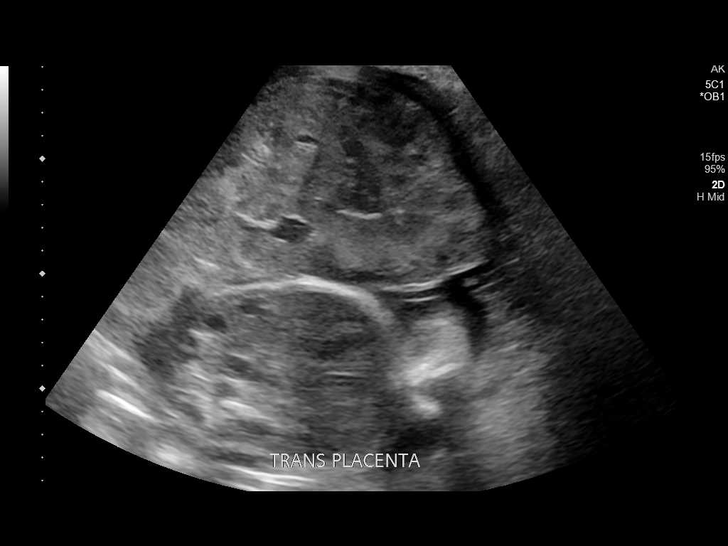
[im 20/22]
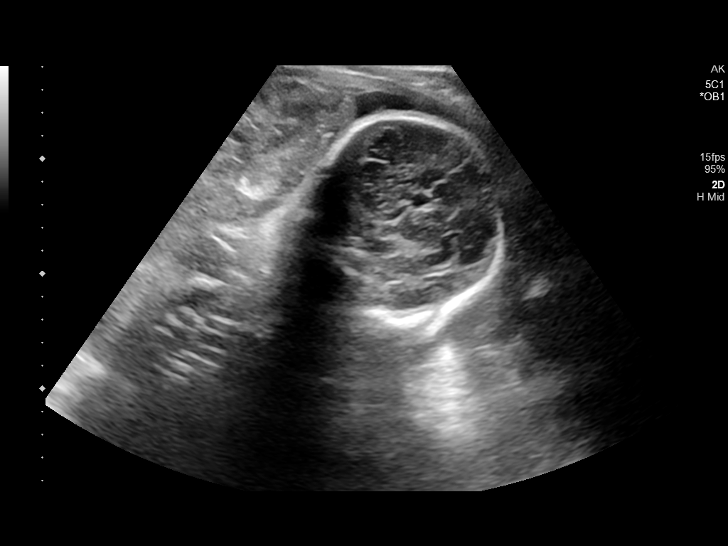
[im 22/22]
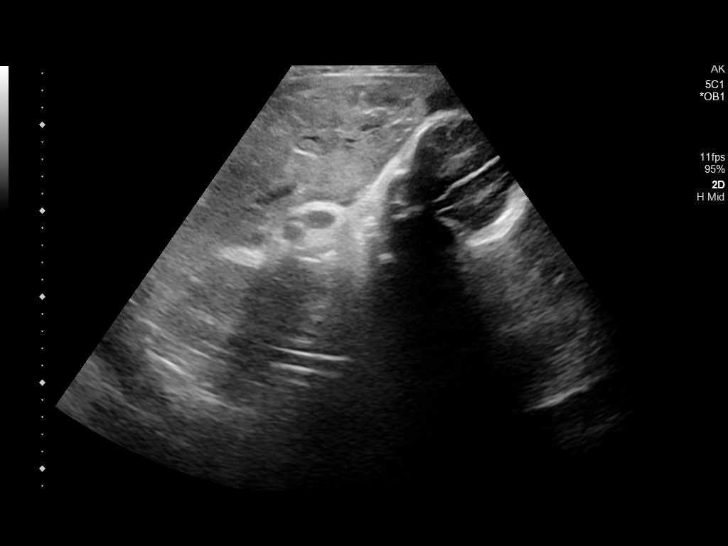

[14 of 22 positions shown; findings below may reference images not displayed]

FINDINGS: Number of Fetuses: 1

Heart Rate:  None.

Movement: None.

Presentation: Cephalic.

Placental Location: Anterior. Placenta is heterogeneous with the
possibility of hemorrhage or abruption cannot be excluded.

Previa: No.

Amniotic Fluid (Subjective):  Decreased.

BPD: Not evaluated.

MATERNAL FINDINGS:

Cervix:  Not evaluated.

Uterus/Adnexae: Not evaluated.
IMPRESSION: No fetal movement or heart rate is noted consistent with fetal
demise. Oligohydramnios is noted as well. Heterogeneous echogenicity
of placenta is noted suggesting the possibility of hemorrhage or
abruption.
